# Patient Record
Sex: Male | Born: 1980 | Race: Black or African American | Hispanic: No | Marital: Single | Smoking: Former smoker
Health system: Southern US, Community
[De-identification: ages and names within clinical notes are randomized; demographics above are authoritative.]

## PROBLEM LIST (undated history)

## (undated) DIAGNOSIS — F209 Schizophrenia, unspecified: Secondary | ICD-10-CM

## (undated) DIAGNOSIS — G4730 Sleep apnea, unspecified: Secondary | ICD-10-CM

---

## 2003-12-10 ENCOUNTER — Emergency Department (HOSPITAL_COMMUNITY): Admission: EM | Admit: 2003-12-10 | Discharge: 2003-12-10 | Payer: Self-pay | Admitting: Emergency Medicine

## 2006-04-19 ENCOUNTER — Ambulatory Visit: Payer: Self-pay | Admitting: Psychiatry

## 2006-04-19 ENCOUNTER — Inpatient Hospital Stay (HOSPITAL_COMMUNITY): Admission: AD | Admit: 2006-04-19 | Discharge: 2006-04-29 | Payer: Self-pay | Admitting: Psychiatry

## 2014-07-26 DIAGNOSIS — F250 Schizoaffective disorder, bipolar type: Secondary | ICD-10-CM | POA: Diagnosis not present

## 2014-10-18 DIAGNOSIS — F250 Schizoaffective disorder, bipolar type: Secondary | ICD-10-CM | POA: Diagnosis not present

## 2015-01-10 DIAGNOSIS — F250 Schizoaffective disorder, bipolar type: Secondary | ICD-10-CM | POA: Diagnosis not present

## 2015-04-09 ENCOUNTER — Emergency Department (HOSPITAL_COMMUNITY): Payer: Medicare Other

## 2015-04-09 ENCOUNTER — Encounter (HOSPITAL_COMMUNITY): Payer: Self-pay

## 2015-04-09 ENCOUNTER — Emergency Department (HOSPITAL_COMMUNITY)
Admission: EM | Admit: 2015-04-09 | Discharge: 2015-04-10 | Disposition: A | Discharge status: Home / Self Care | Payer: Medicare Other | Attending: Emergency Medicine | Admitting: Emergency Medicine

## 2015-04-09 DIAGNOSIS — R05 Cough: Secondary | ICD-10-CM | POA: Diagnosis not present

## 2015-04-09 DIAGNOSIS — R079 Chest pain, unspecified: Secondary | ICD-10-CM | POA: Diagnosis not present

## 2015-04-09 DIAGNOSIS — Z87891 Personal history of nicotine dependence: Secondary | ICD-10-CM | POA: Diagnosis not present

## 2015-04-09 DIAGNOSIS — R509 Fever, unspecified: Secondary | ICD-10-CM | POA: Diagnosis not present

## 2015-04-09 DIAGNOSIS — R000 Tachycardia, unspecified: Secondary | ICD-10-CM | POA: Diagnosis not present

## 2015-04-09 DIAGNOSIS — F209 Schizophrenia, unspecified: Secondary | ICD-10-CM | POA: Insufficient documentation

## 2015-04-09 DIAGNOSIS — R0602 Shortness of breath: Secondary | ICD-10-CM | POA: Diagnosis not present

## 2015-04-09 DIAGNOSIS — R0989 Other specified symptoms and signs involving the circulatory and respiratory systems: Secondary | ICD-10-CM | POA: Diagnosis not present

## 2015-04-09 DIAGNOSIS — B349 Viral infection, unspecified: Secondary | ICD-10-CM | POA: Diagnosis not present

## 2015-04-09 DIAGNOSIS — R059 Cough, unspecified: Secondary | ICD-10-CM

## 2015-04-09 HISTORY — DX: Schizophrenia, unspecified: F20.9

## 2015-04-09 LAB — BASIC METABOLIC PANEL
Anion gap: 10 (ref 5–15)
BUN: 14 mg/dL (ref 6–20)
CALCIUM: 9.3 mg/dL (ref 8.9–10.3)
CO2: 25 mmol/L (ref 22–32)
CREATININE: 1.14 mg/dL (ref 0.61–1.24)
Chloride: 103 mmol/L (ref 101–111)
GFR calc non Af Amer: >60 mL/min (ref >60)
Glucose, Bld: 102 mg/dL — ABNORMAL HIGH (ref 65–99)
Potassium: 4.2 mmol/L (ref 3.5–5.1)
SODIUM: 138 mmol/L (ref 135–145)

## 2015-04-09 LAB — TROPONIN I: Troponin I: <0.03 ng/mL (ref <0.031)

## 2015-04-09 LAB — CBC WITH DIFFERENTIAL/PLATELET
BASOS PCT: 0 %
Basophils Absolute: 0 10*3/uL (ref 0.0–0.1)
EOS ABS: 0 10*3/uL (ref 0.0–0.7)
Eosinophils Relative: 0 %
HEMATOCRIT: 42.1 % (ref 39.0–52.0)
Hemoglobin: 14.2 g/dL (ref 13.0–17.0)
Lymphocytes Relative: 14 %
Lymphs Abs: 1.5 10*3/uL (ref 0.7–4.0)
MCH: 32.1 pg (ref 26.0–34.0)
MCHC: 33.7 g/dL (ref 30.0–36.0)
MCV: 95.2 fL (ref 78.0–100.0)
MONO ABS: 1.1 10*3/uL — AB (ref 0.1–1.0)
MONOS PCT: 10 %
Neutro Abs: 8.6 10*3/uL — ABNORMAL HIGH (ref 1.7–7.7)
Neutrophils Relative %: 76 %
Platelets: 162 10*3/uL (ref 150–400)
RBC: 4.42 MIL/uL (ref 4.22–5.81)
RDW: 14.2 % (ref 11.5–15.5)
WBC: 11.3 10*3/uL — ABNORMAL HIGH (ref 4.0–10.5)

## 2015-04-09 LAB — D-DIMER, QUANTITATIVE (NOT AT ARMC)

## 2015-04-09 MED ORDER — SODIUM CHLORIDE 0.9 % IV BOLUS (SEPSIS)
1000.0000 mL | Freq: Once | INTRAVENOUS | Status: AC
  Administered 2015-04-09: 1000 mL via INTRAVENOUS

## 2015-04-09 MED ORDER — ACETAMINOPHEN 325 MG PO TABS
ORAL_TABLET | ORAL | Status: AC
  Filled 2015-04-09: qty 2

## 2015-04-09 MED ORDER — IBUPROFEN 800 MG PO TABS
800.0000 mg | ORAL_TABLET | Freq: Once | ORAL | Status: AC
  Administered 2015-04-09: 800 mg via ORAL
  Filled 2015-04-09: qty 1

## 2015-04-09 MED ORDER — SODIUM CHLORIDE 0.9 % IV BOLUS (SEPSIS)
1000.0000 mL | Freq: Once | INTRAVENOUS | Status: AC
  Administered 2015-04-10: 1000 mL via INTRAVENOUS

## 2015-04-09 MED ORDER — ACETAMINOPHEN 325 MG PO TABS
650.0000 mg | ORAL_TABLET | Freq: Once | ORAL | Status: AC | PRN
  Administered 2015-04-09: 650 mg via ORAL

## 2015-04-09 NOTE — ED Provider Notes (Signed)
CSN: [id number]     Arrival date & time 04/09/15  1740 History   First MD Initiated Contact with Patient 04/09/15 2152     Chief Complaint  Patient presents with  . Fever  . Cough     (Consider location/radiation/quality/duration/timing/severity/associated sxs/prior Treatment) HPI Comments: Patient from home with 2 day history of intermittent right-sided chest pain that comes and goes. Associated with cough now productive of mucus. Fever today to 102 with tachycardia in triage. Denies any difficulty breathing or swallowing currently. Endorses sore throat which is now resolved. Denies any chest pain currently. With right-sided sharp and stabbing lasting just a few seconds at a time. No leg pain or leg swelling. History of schizophrenia and is a smoker. No recent long car trips or plane trips. No sick contacts.  Patient is a 34 y.o. male presenting with cough. The history is provided by the patient.  Cough Associated symptoms: fever and shortness of breath   Associated symptoms: no chest pain, no headaches, no myalgias and no rhinorrhea     Past Medical History  Diagnosis Date  . Schizophrenia    History reviewed. No pertinent past surgical history. History reviewed. No pertinent family history. Social History  Substance Use Topics  . Smoking status: Former Games developer  . Smokeless tobacco: None  . Alcohol Use: Yes     Comment: occ    Review of Systems  Constitutional: Positive for fever and activity change.  HENT: Negative for congestion and rhinorrhea.   Respiratory: Positive for cough, chest tightness and shortness of breath.   Cardiovascular: Negative for chest pain.  Gastrointestinal: Negative for nausea, vomiting and abdominal pain.  Genitourinary: Negative for dysuria, urgency and hematuria.  Musculoskeletal: Negative for myalgias, back pain and arthralgias.  Skin: Negative for wound.  Neurological: Negative for dizziness, weakness and headaches.  A complete 10 system  review of systems was obtained and all systems are negative except as noted in the HPI and PMH.      Allergies  Review of patient's allergies indicates no known allergies.  Home Medications   Prior to Admission medications   Medication Sig Start Date End Date Taking? Authorizing Provider  divalproex (DEPAKOTE ER) 500 MG 24 hr tablet Take 1,000 mg by mouth at bedtime.   Yes Historical Provider, MD  OLANZapine (ZYPREXA) 5 MG tablet Take 5 mg by mouth at bedtime.   Yes Historical Provider, MD  OLANZapine zydis (ZYPREXA) 20 MG disintegrating tablet Take 20 mg by mouth at bedtime.   Yes Historical Provider, MD   BP 125/73 mmHg  Pulse 90  Temp(Src) 98.8 F (37.1 C) (Oral)  Resp 16  Ht  (1.778 m)  Wt 128 lb 1.6 oz (58.106 kg)  BMI 18.38 kg/m2  SpO2 95% Physical Exam  Constitutional: He is oriented to person, place, and time. He appears well-developed and well-nourished. No distress.  Well appearing, no distress  HENT:  Head: Normocephalic and atraumatic.  Mouth/Throat: Oropharynx is clear and moist. No oropharyngeal exudate.  Eyes: Conjunctivae and EOM are normal. Pupils are equal, round, and reactive to light.  Neck: Normal range of motion. Neck supple.  No meningismus.  Cardiovascular: Normal rate, normal heart sounds and intact distal pulses.   No murmur heard. Tachycardia to 110  Pulmonary/Chest: Effort normal and breath sounds normal. No respiratory distress. He has no rales.  Scattered rhonchi throughout  Abdominal: Soft. There is no tenderness. There is no rebound and no guarding.  Musculoskeletal: Normal range of motion. He exhibits  no edema or tenderness.  Neurological: He is alert and oriented to person, place, and time. No cranial nerve deficit. He exhibits normal muscle tone. Coordination normal.  No ataxia on finger to nose bilaterally. No pronator drift. 5/5 strength throughout. CN 2-12 intact. Negative Romberg. Equal grip strength. Sensation intact. Gait is  normal.   Skin: Skin is warm.  Psychiatric: He has a normal mood and affect. His behavior is normal.  Nursing note and vitals reviewed.   ED Course  Procedures (including critical care time) Labs Review Labs Reviewed  CBC WITH DIFFERENTIAL/PLATELET - Abnormal; Notable for the following:    WBC 11.3 (*)    Neutro Abs 8.6 (*)    Monocytes Absolute 1.1 (*)    All other components within normal limits  BASIC METABOLIC PANEL - Abnormal; Notable for the following:    Glucose, Bld 102 (*)    All other components within normal limits  CULTURE, BLOOD (ROUTINE X 2)  CULTURE, BLOOD (ROUTINE X 2)  TROPONIN I  D-DIMER, QUANTITATIVE (NOT AT [facility])  I-STAT CG4 LACTIC ACID, ED    Imaging Review Dg Chest 2 View  04/09/2015   CLINICAL DATA:  Dry cough and fever began today. No chest pain or SOB.  EXAM: CHEST  2 VIEW  COMPARISON:  None.  FINDINGS: The heart size and mediastinal contours are within normal limits. Both lungs are clear. Mild perihilar peribronchial thickening. The visualized skeletal structures are unremarkable.  IMPRESSION: 1. Mild bronchitic changes. 2.  No focal acute pulmonary abnormality.   Electronically Signed   By: Norva Pavlov M.D.   On: 04/09/2015 23:00   I have personally reviewed and evaluated these images and lab results as part of my medical decision-making.   EKG Interpretation   Date/Time:  Tuesday April 09 2015 17:53:49 EDT Ventricular Rate:  130 PR Interval:  124 QRS Duration: 80 QT Interval:  294 QTC Calculation: 432 R Axis:   95 Text Interpretation:  Sinus tachycardia Right atrial enlargement Rightward  axis Borderline ECG No previous ECGs available Confirmed by Manus Gunning  MD,  STEPHEN [phone]) on 04/09/2015 11:50:41 PM      MDM   Final diagnoses:  Viral syndrome  Cough    right-sided chest pain cough and right-sided chest pain for the past 2 days. Febrile to 102.5 on arrival and tachycardic.   IV fluids given labs obtained. Chest x-ray shows  no infiltrate.   Labs unremarkable. Lactate normal.  D-dimer negative. Patient given IV fluids and antipyretics in the ED. Suspect viral syndrome causing cough, fever and tachycardia. Possibly influenza.  he is nontoxic appearing and tolerating by mouth. He is ambulatory without desaturation.  HR improved to 90.  Temp 98.8.  No hypoxia.  Needs to establish care with PCP. Return precautions discussed.  Glynn Octave, MD 04/10/15 [phone]

## 2015-04-09 NOTE — ED Notes (Signed)
Onset last night right sided chest pain x 2 that lasted few minutes.  No chest pain today.  Onset last night NP cough.  Pt reports fever today.  No swallowing or respiratory difficulties.  NAD.

## 2015-04-10 ENCOUNTER — Telehealth (HOSPITAL_COMMUNITY): Payer: Self-pay | Admitting: Emergency Medicine

## 2015-04-10 DIAGNOSIS — B349 Viral infection, unspecified: Secondary | ICD-10-CM | POA: Diagnosis not present

## 2015-04-10 LAB — I-STAT CG4 LACTIC ACID, ED: LACTIC ACID, VENOUS: 0.73 mmol/L (ref 0.5–2.0)

## 2015-04-10 NOTE — Discharge Instructions (Signed)
Cough, Adult ° A cough is a reflex that helps clear your throat and airways. It can help heal the body or may be a reaction to an irritated airway. A cough may only last 2 or 3 weeks (acute) or may last more than 8 weeks (chronic).  °CAUSES °Acute cough: °· Viral or bacterial infections. °Chronic cough: °· Infections. °· Allergies. °· Asthma. °· Post-nasal drip. °· Smoking. °· Heartburn or acid reflux. °· Some medicines. °· Chronic lung problems (COPD). °· Cancer. °SYMPTOMS  °· Cough. °· Fever. °· Chest pain. °· Increased breathing rate. °· High-pitched whistling sound when breathing (wheezing). °· Colored mucus that you cough up (sputum). °TREATMENT  °· A bacterial cough may be treated with antibiotic medicine. °· A viral cough must run its course and will not respond to antibiotics. °· Your caregiver may recommend other treatments if you have a chronic cough. °HOME CARE INSTRUCTIONS  °· Only take over-the-counter or prescription medicines for pain, discomfort, or fever as directed by your caregiver. Use cough suppressants only as directed by your caregiver. °· Use a cold steam vaporizer or humidifier in your bedroom or home to help loosen secretions. °· Sleep in a semi-upright position if your cough is worse at night. °· Rest as needed. °· Stop smoking if you smoke. °SEEK IMMEDIATE MEDICAL CARE IF:  °· You have pus in your sputum. °· Your cough starts to worsen. °· You cannot control your cough with suppressants and are losing sleep. °· You begin coughing up blood. °· You have difficulty breathing. °· You develop pain which is getting worse or is uncontrolled with medicine. °· You have a fever. °MAKE SURE YOU:  °· Understand these instructions. °· Will watch your condition. °· Will get help right away if you are not doing well or get worse. °Document Released: 01/02/2011 Document Revised: 09/28/2011 Document Reviewed: 01/02/2011 °ExitCare® Patient Information ©2015 [employer]. This information is not intended  to replace advice given to you by your health care provider. Make sure you discuss any questions you have with your health care provider. ° °Emergency Department Resource Guide °1) Find a Doctor and Pay Out of Pocket °Although you won't have to find out who is covered by your insurance plan, it is a good idea to ask around and get recommendations. You will then need to call the office and see if the doctor you have chosen will accept you as a new patient and what types of options they offer for patients who are self-pay. Some doctors offer discounts or will set up payment plans for their patients who do not have insurance, but you will need to ask so you aren't surprised when you get to your appointment. ° °2) Contact Your Local Health Department °Not all health departments have doctors that can see patients for sick visits, but many do, so it is worth a call to see if yours does. If you don't know where your local health department is, you can check in your phone book. The CDC also has a tool to help you locate your state's health department, and many state websites also have listings of all of their local health departments. ° °3) Find a Walk-in Clinic °If your illness is not likely to be very severe or complicated, you may want to try a walk in clinic. These are popping up all over the country in pharmacies, drugstores, and shopping centers. They're usually staffed by nurse practitioners or physician assistants that have been trained to treat common illnesses   and complaints. They're usually fairly quick and inexpensive. However, if you have serious medical issues or chronic medical problems, these are probably not your best option. ° °No Primary Care Doctor: °- Call Health Connect at  832-8000 - they can help you locate a primary care doctor that  accepts your insurance, provides certain services, etc. °- Physician Referral Service- 1-800-533-3463 ° °Chronic Pain Problems: °Organization         Address  Phone    Notes  °[facility]  (336) 297-2271 Patients need to be referred by their primary care doctor.  ° °Medication Assistance: °Organization         Address  Phone   Notes  °[place] [facility] [address] °[place], [place] [address] (336) 641-8030 --Must be a resident of [place] °-- Must have NO insurance coverage whatsoever (no Medicaid/ Medicare, etc.) °-- The pt. MUST have a primary care doctor that directs their care regularly and follows them in the community °  °[facility]  (866) 331-1348   °[employer]  (888) 892-1162   ° °Agencies that provide inexpensive medical care: °Organization         Address  Phone   Notes  °[facility]  (336) 832-8035   °[facility]    (336) 832-7272   °[facility] [address] °[place], [place] [address] (336) 832-4777   °[facility] [address], °[place] (336) 271-4999   °[facility]    (336) 373-0678   °[facility]    (336) 272-1050   °[facility] ° [address], [place] Phone:  (336) 832-4444, Fax:  (336) 832-4440 Hours of Operation:  9 am - 6 pm, M-F.  Also accepts Medicaid/Medicare and self-pay.  °[facility] ° [address], [place] Phone: (336) 832-3150, Fax: (336) 832-3151. Hours of Operation:  8:30 am - 5:30 pm, M-F.  Also accepts Medicaid and self-pay.  °[facility] [place] [address], [place] Phone: (336) 878-6027   °[facility] [address], [place], [place] (336)723-1848, Ext. 123 Mondays & Thursdays: 7-9 AM.  First 15 patients are seen on a first come, first serve basis. °  ° °Medicaid-accepting [place] Providers: ° °Organization         Address  Phone   Notes  °[facility] [address], [place] (336) 641-2100 Also accepts self-pay patients.  °[facility]  [address], [place] ° (336) 856-9996   °[facility] [address], [place] (336) 288-8857   °[facility] [address] [place] [address], [place] (336) 299-7000   °Veita Bland [address], [place]  ° (336) 373-1557 Only accepts [place] Access Medicaid patients after they have their name applied to their card.  ° °Self-Pay (no insurance) in [place]: ° °Organization         Address  Phone   Notes  °Sickle Cell Patients, [facility] [address], [place] (336) 832-1970   °[facility] [address], [place] (336) 832-4400   °[facility] ° [address], [place] (336) 992-4800   °[facility]/Dr. Osei-Bonsu ° [address] [place] [address],  or [address], [place] (336) 841-8500 Phone number for both [place] and [place] locations is the same.  °[facility] [address], [place] (336) 299-0000   °[facility] [address] [place] [address], [place] or [address] (336) 852-7530 °(336) 878-2260   °[facility] [address], [place] (336) 350-1642, phone; (336) 294-5005, fax Sees patients 1st and 3rd Saturday of every month.  Must not qualify for public or private insurance (i.e. Medicaid, Medicare, [place] Health Choice, Veterans' Benefits) • Household income should be no more than 200% of the poverty level •The clinic cannot treat you if you are pregnant or think you are pregnant • Sexually transmitted diseases are not treated at the clinic.  ° ° °Dental Care: °Organization         Address  Phone  Notes  °[facility] [address], [place] (336) 641-6152 Accepts children up to age 21 who are enrolled in Medicaid or [place] Health Choice; pregnant women with a Medicaid card; and children who have  applied for Medicaid or [place] Health Choice, but were declined, whose parents can pay a reduced fee at time of service.  °[facility] [place]  [address], [place] (336) 641-7733 Accepts children up to age 21 who are enrolled in Medicaid or [place] Health Choice; pregnant women with a Medicaid card; and children who have applied for Medicaid or [place] Health Choice, but were declined, whose parents can pay a reduced fee at time of service.  °[facility] ° [address], [place] (336) 641-4533 Patients are seen by appointment only. Walk-ins are not accepted. [facility] will see patients 18 years of age and older. °Monday - Tuesday (8am-5pm) °Most Wednesdays (8:30-5pm) °$30 per visit, cash only  °[facility] ° [address], [place] (336) 641-4533 Patients are seen by appointment only. Walk-ins are not accepted. [facility] will see patients 18 years of age and older. °One Wednesday Evening (Monthly: Volunteer Based).  $30 per visit, cash only  °[facility]  (919) 537-3737 for adults; Children under age 4, call Graduate Pediatric Dentistry at (919) [phone]. Children aged 4-14, please call (919) [phone] to request a pediatric application. ° Dental services are provided in all areas of dental care including fillings, crowns and bridges, complete and partial dentures, implants, gum treatment, root canals, and extractions. Preventive care is also provided. Treatment is provided to both adults and children. °Patients are selected via a lottery and there is often a waiting list. °  °[facility] [address], °[place] ° (336) 763-8833 [email or web address] °  °[facility] [address], [place], [place] (336)723-1848, Ext. 123 Second and Fourth Thursday of each month, opens at 6:30 AM; Clinic ends at 9 AM.  Patients are seen on a first-come first-served basis, and a  limited number are seen during each clinic.  ° °[facility] ° [address], [place], [place] (336) 723-7904   Eligibility Requirements °You must have lived in [place], [place], or [place] counties for at least the last three months. °  You cannot be eligible for state or federal sponsored healthcare insurance, including Veterans Administration, Medicaid, or Medicare. °  You generally cannot be eligible for healthcare insurance through your employer.  °  How to apply: °Eligibility screenings are held every Tuesday and Wednesday   afternoon from 1:00 pm until 4:00 pm. You do not need an appointment for the interview!  °[facility] [address], [place], [place] 336-631-2330   °[facility] [facility]  336-342-8273   °[facility]  336-703-3100   °[facility]  336-570-6415   ° °Behavioral Health Resources in the Community: °Intensive Outpatient Programs °Organization         Address  Phone  Notes  °[facility] [address], [place], [place] 336-878-6098   °[facility] [address], [place], [place] 336-832-9800   °[facility] [address], [place], [place] ° 336-882-2125   °[facility] [address],  °[place], [place] 1-800-853-5163 or 336-641-4981   °Substance Abuse Resources °Organization         Address  Phone  Notes  °[facility]  336-882-2125   °[facility]  336-784-9470   °[facility]  336-285-9073   °[facility]  336-845-3988   °Residential & Outpatient Substance Abuse Program  1-800-659-3381   °Psychological Services °Organization         Address  Phone  Notes  °[facility]  336- 832-9600   °[facility]  336- 378-7881   °[facility] [address], [place] 1-800-853-5163 or 336-641-4981   ° °Mobile Crisis Teams °Organization          Address  Phone  Notes  °[facility], Mobile Crisis Care Unit  1-877-626-1772   °Assertive °[facility] ° [address] [place], [place] 336-834-9664   °Sharon DeEsch [address] °[place] 336-554-5454   ° °Self-Help/Support Groups °Organization         Address  Phone             Notes  °[facility] [place] - variety of support groups  336- 373-1402 Call for more information  °Narcotics Anonymous (NA), [facility] [address], °[place]  2 meetings at this location  ° °Residential Treatment Programs °Organization         Address  Phone  Notes  °[facility] [address],    °[place]  1-866-801-8205   °[facility] ° [address], [place], [place] 704-293-8524   °[facility] [address], [place] 336-845-3988 Admissions: 8am-3pm M-F  °[facility] [address]    °[place], [place] 336-841-1104   °[facility] [address], [place], [place] 336-379-7146   °[facility] [address]  °[place], [place] 336-285-9073   °[facility] - Intensive Outpatient [address], [place], [place] 336-852-3033   °[facility] [address]  °[place], [place] 1-877-615-2722 or 336-784-9470   °[facility] [address] [place], [place] 336-227-7417 Accepts Medicaid  °[facility] [address]  °[place] 1-800-659-3381 Substance Abuse/Addiction Treatment  ° °[place] Behavioral Health Resources °Organization         Address  Phone  Notes  °[facility]  (888) 581-9988   °Julie Brannon, PhD [address] [place], [place]   (336) 349-5553 or (336) 951-0000   °[facility]   [address] °[place], [place] (336) 349-4454   °[facility] [address], [place],  (336) 342-8316 Insurance/Medicaid/sponsorship  through [facility]  °[facility] [address]                                    [place], [place] (336) 342-8316 Therapy/tele-psych/case  °[facility] [address]  ° [place], [place] (336) 349-2233    °Dr. Arfeen  (336) 349-4544   °[facility] [place]  [employer] [place] [facility] 1) [address], [place] °2) [address], [place] °3)  [address], [place] (336) 349-3220 °(336) 342-7768 ° °(336) 342-8140   °[place] [facility] (336) 342-1394 or (336) 342-3537 (After Hours)    ° ° °

## 2015-04-10 NOTE — ED Notes (Signed)
Pt able to dress independently in the room and ambulate to lobby with RN

## 2015-04-13 LAB — CULTURE, BLOOD (ROUTINE X 2)

## 2015-04-14 ENCOUNTER — Telehealth (HOSPITAL_COMMUNITY): Payer: Self-pay

## 2015-04-14 NOTE — Progress Notes (Signed)
ED Antimicrobial Stewardship Positive Culture Follow Up   Cesar Robinson is an 34 y.o. male who presented to [facility] on 04/09/2015 with a chief complaint of  Chief Complaint  Patient presents with  . Fever  . Cough    Recent Results (from the past 720 hour(s))  Blood culture (routine x 2)     Status: None (Preliminary result)   Collection Time: 04/09/15 10:49 PM  Result Value Ref Range Status   Specimen Description BLOOD RIGHT ARM  Final   Special Requests BOTTLES DRAWN AEROBIC ONLY 10CC  Final   Culture NO GROWTH 3 DAYS  Final   Report Status PENDING  Incomplete  Blood culture (routine x 2)     Status: None   Collection Time: 04/10/15 12:23 AM  Result Value Ref Range Status   Specimen Description BLOOD RIGHT ARM  Final   Special Requests BOTTLES DRAWN AEROBIC AND ANAEROBIC 4CC  Final   Culture  Setup Time   Final    GRAM POSITIVE COCCI IN PAIRS IN CHAINS IN BOTH AEROBIC AND ANAEROBIC BOTTLES CRITICAL RESULT CALLED TO, READ BACK BY AND VERIFIED WITH: Cesar Mallick RN 1552 04/10/15 A BROWNING    Culture   Final    VIRIDANS STREPTOCOCCUS STAPHYLOCOCCUS SPECIES (COAGULASE NEGATIVE) THE SIGNIFICANCE OF ISOLATING THIS ORGANISM FROM A SINGLE VENIPUNCTURE CANNOT BE PREDICTED WITHOUT FURTHER CLINICAL AND CULTURE CORRELATION. SUSCEPTIBILITIES AVAILABLE ONLY ON REQUEST.    Report Status 04/13/2015 FINAL  Final    Patient had 1/2 BC positive for CONS. No tx indicated  ED Provider: Catha Gosselin PA-C   Bertram Millard 04/14/2015, 9:16 AM Infectious Diseases Pharmacist Phone# [phone]

## 2015-04-14 NOTE — Telephone Encounter (Signed)
Post ED Visit - Positive Culture Follow-up  Culture report reviewed by antimicrobial stewardship pharmacist:   Celedonio Miyamoto, Pharm.D., BCPS  Georgina Pillion, Pharm.D., BCPS  [place], [place].D., BCPS, AAHIVP  Estella Husk, Pharm.D., BCPS, AAHIVP  [place], [address].D.  Tennis Must, Pharm.D. Jules Husbands  Positive blood culture  no further patient follow-up is required at this time.  Ashley Jacobs 04/14/2015, 10:48 AM

## 2015-04-15 LAB — CULTURE, BLOOD (ROUTINE X 2): Culture: NO GROWTH

## 2015-04-17 DIAGNOSIS — F250 Schizoaffective disorder, bipolar type: Secondary | ICD-10-CM | POA: Diagnosis not present

## 2015-05-08 DIAGNOSIS — Z131 Encounter for screening for diabetes mellitus: Secondary | ICD-10-CM | POA: Diagnosis not present

## 2015-05-08 DIAGNOSIS — J069 Acute upper respiratory infection, unspecified: Secondary | ICD-10-CM | POA: Diagnosis not present

## 2015-05-08 DIAGNOSIS — Z13220 Encounter for screening for lipoid disorders: Secondary | ICD-10-CM | POA: Diagnosis not present

## 2015-05-08 DIAGNOSIS — Z1389 Encounter for screening for other disorder: Secondary | ICD-10-CM | POA: Diagnosis not present

## 2015-05-08 DIAGNOSIS — F17200 Nicotine dependence, unspecified, uncomplicated: Secondary | ICD-10-CM | POA: Diagnosis not present

## 2015-05-08 DIAGNOSIS — F2089 Other schizophrenia: Secondary | ICD-10-CM | POA: Diagnosis not present

## 2015-05-25 ENCOUNTER — Telehealth (HOSPITAL_COMMUNITY): Payer: Self-pay

## 2015-05-25 NOTE — Telephone Encounter (Signed)
Unable to contact pt by mail or telephone. Unable to communicate lab results or treatment changes. 

## 2015-06-19 DIAGNOSIS — F17210 Nicotine dependence, cigarettes, uncomplicated: Secondary | ICD-10-CM | POA: Diagnosis not present

## 2015-06-19 DIAGNOSIS — F2089 Other schizophrenia: Secondary | ICD-10-CM | POA: Diagnosis not present

## 2015-07-11 DIAGNOSIS — F250 Schizoaffective disorder, bipolar type: Secondary | ICD-10-CM | POA: Diagnosis not present

## 2015-08-19 DIAGNOSIS — F250 Schizoaffective disorder, bipolar type: Secondary | ICD-10-CM | POA: Diagnosis not present

## 2015-09-16 DIAGNOSIS — H6123 Impacted cerumen, bilateral: Secondary | ICD-10-CM | POA: Diagnosis not present

## 2015-09-16 DIAGNOSIS — F17210 Nicotine dependence, cigarettes, uncomplicated: Secondary | ICD-10-CM | POA: Diagnosis not present

## 2015-09-16 DIAGNOSIS — F2089 Other schizophrenia: Secondary | ICD-10-CM | POA: Diagnosis not present

## 2015-11-12 DIAGNOSIS — F250 Schizoaffective disorder, bipolar type: Secondary | ICD-10-CM | POA: Diagnosis not present

## 2016-02-06 DIAGNOSIS — F250 Schizoaffective disorder, bipolar type: Secondary | ICD-10-CM | POA: Diagnosis not present

## 2016-03-16 DIAGNOSIS — F2089 Other schizophrenia: Secondary | ICD-10-CM | POA: Diagnosis not present

## 2016-03-16 DIAGNOSIS — H6123 Impacted cerumen, bilateral: Secondary | ICD-10-CM | POA: Diagnosis not present

## 2016-03-16 DIAGNOSIS — Z13220 Encounter for screening for lipoid disorders: Secondary | ICD-10-CM | POA: Diagnosis not present

## 2016-03-16 DIAGNOSIS — Z131 Encounter for screening for diabetes mellitus: Secondary | ICD-10-CM | POA: Diagnosis not present

## 2016-03-16 DIAGNOSIS — Z79899 Other long term (current) drug therapy: Secondary | ICD-10-CM | POA: Diagnosis not present

## 2016-04-06 DIAGNOSIS — F250 Schizoaffective disorder, bipolar type: Secondary | ICD-10-CM | POA: Diagnosis not present

## 2016-06-29 DIAGNOSIS — F250 Schizoaffective disorder, bipolar type: Secondary | ICD-10-CM | POA: Diagnosis not present

## 2016-09-17 DIAGNOSIS — F2089 Other schizophrenia: Secondary | ICD-10-CM | POA: Diagnosis not present

## 2016-09-17 DIAGNOSIS — F17210 Nicotine dependence, cigarettes, uncomplicated: Secondary | ICD-10-CM | POA: Diagnosis not present

## 2016-09-17 DIAGNOSIS — Z716 Tobacco abuse counseling: Secondary | ICD-10-CM | POA: Diagnosis not present

## 2016-10-01 DIAGNOSIS — Z79899 Other long term (current) drug therapy: Secondary | ICD-10-CM | POA: Diagnosis not present

## 2016-10-01 DIAGNOSIS — F250 Schizoaffective disorder, bipolar type: Secondary | ICD-10-CM | POA: Diagnosis not present

## 2017-01-28 DIAGNOSIS — F250 Schizoaffective disorder, bipolar type: Secondary | ICD-10-CM | POA: Diagnosis not present

## 2017-04-08 DIAGNOSIS — F250 Schizoaffective disorder, bipolar type: Secondary | ICD-10-CM | POA: Diagnosis not present

## 2017-05-26 IMAGING — DX DG CHEST 2V
2 series · 2 of 2 positions shown · non-contrast
Comparison: None.

CLINICAL DATA: Dry cough and fever began today. No chest pain or
SOB.

EXAM:
CHEST  2 VIEW

[w chest pa]
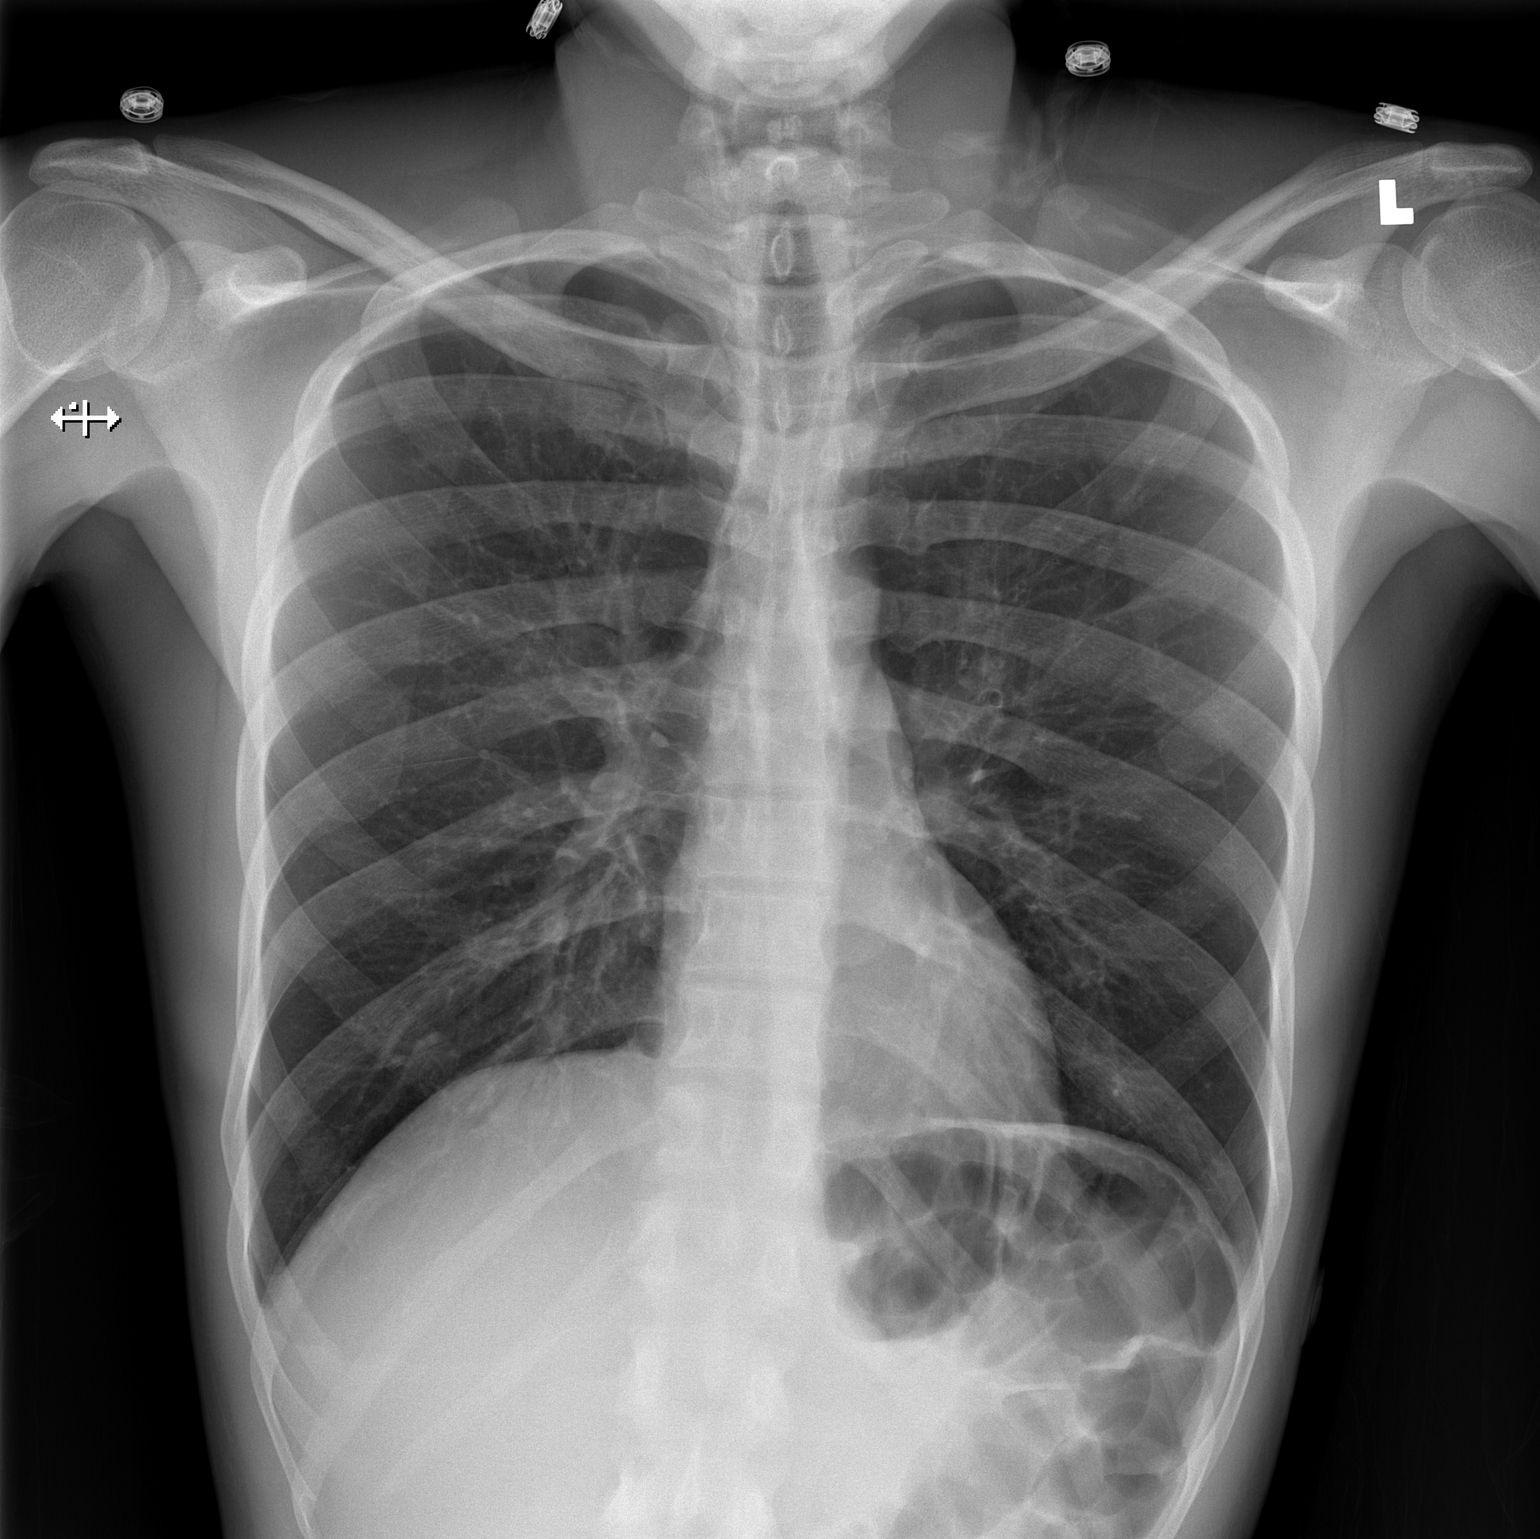

[w chest lat]
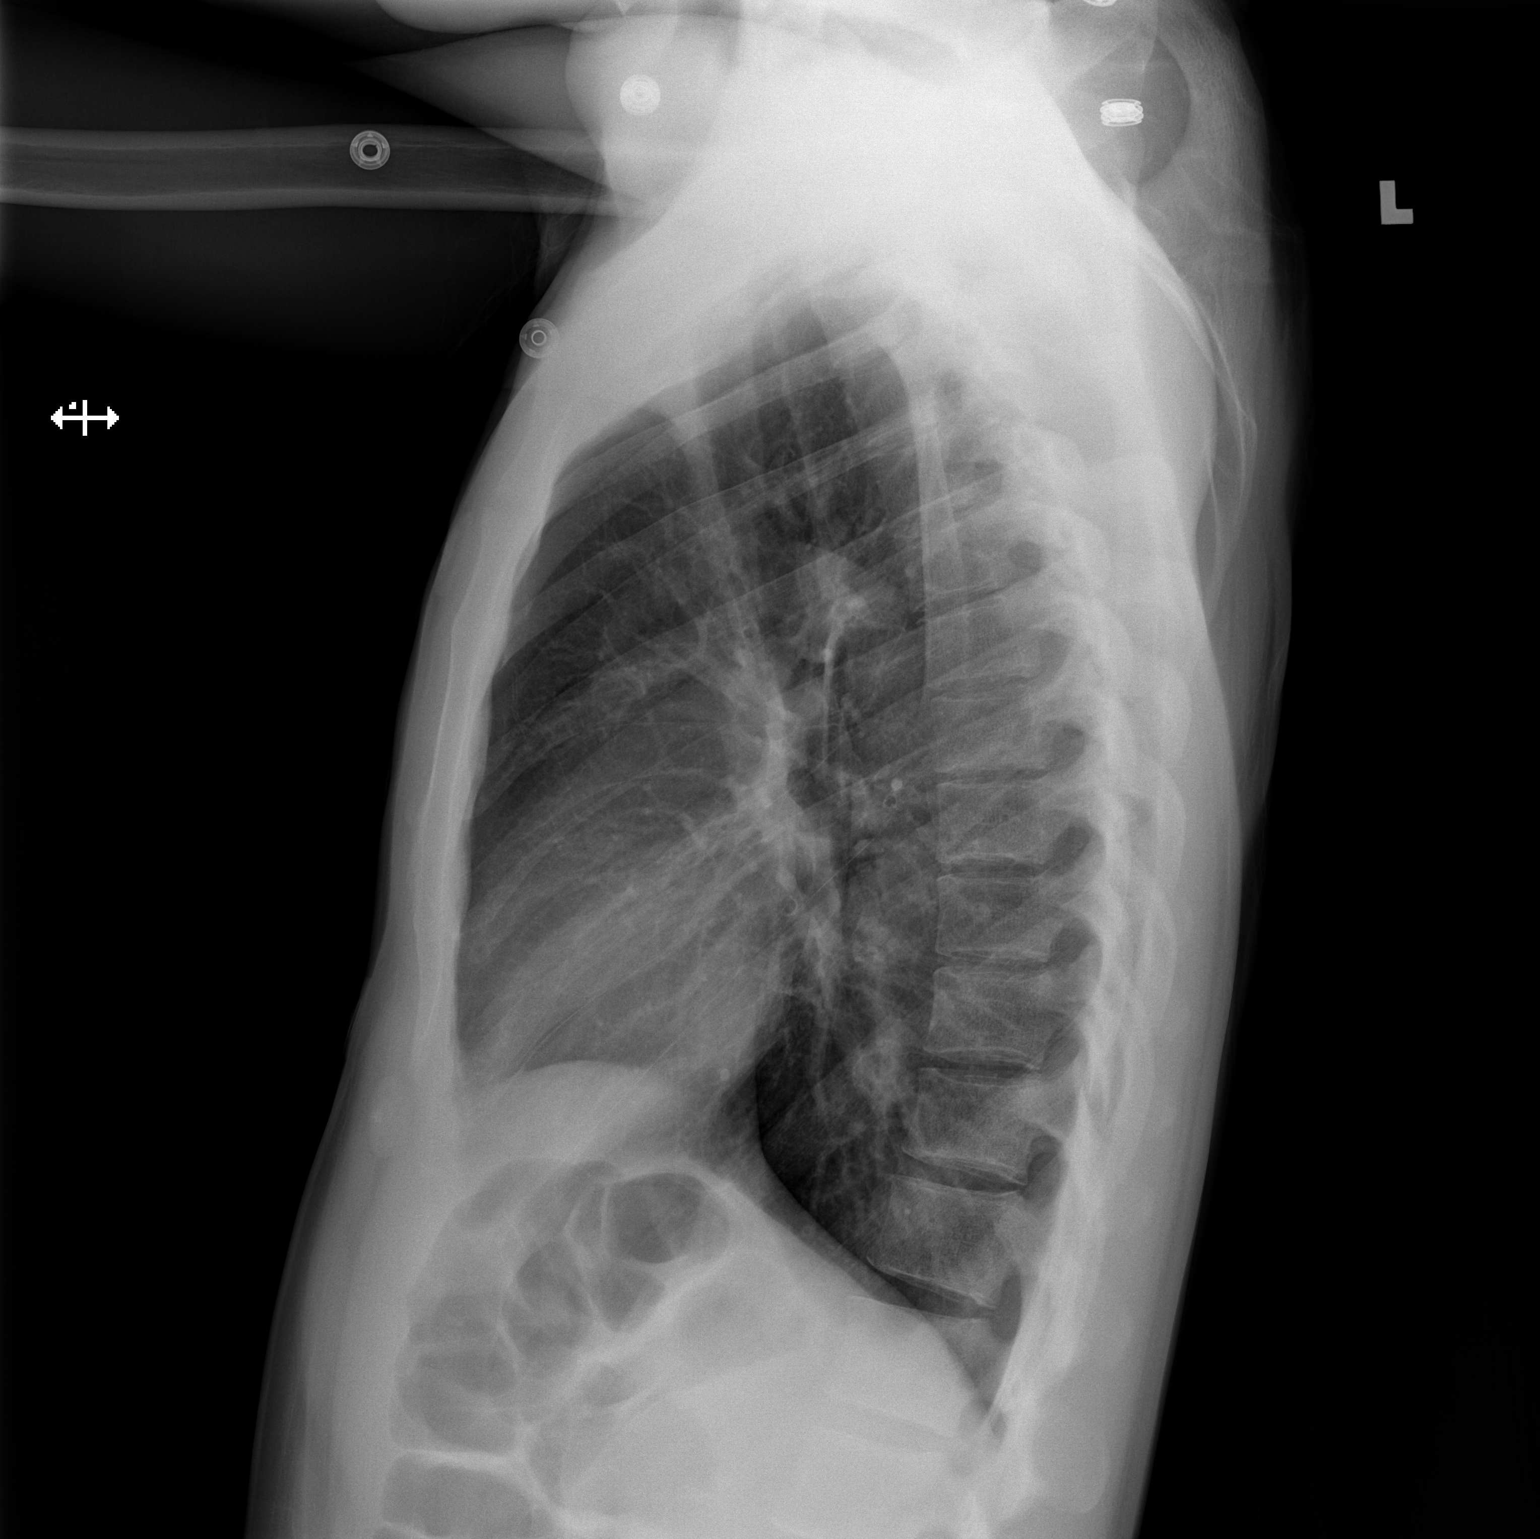

[2 of 2 positions shown; findings below may reference images not displayed]

FINDINGS: The heart size and mediastinal contours are within normal limits.
Both lungs are clear. Mild perihilar peribronchial thickening. The
visualized skeletal structures are unremarkable.
IMPRESSION: 1. Mild bronchitic changes.
2.  No focal acute pulmonary abnormality.

## 2017-06-24 DIAGNOSIS — F250 Schizoaffective disorder, bipolar type: Secondary | ICD-10-CM | POA: Diagnosis not present

## 2017-09-10 DIAGNOSIS — Z716 Tobacco abuse counseling: Secondary | ICD-10-CM | POA: Diagnosis not present

## 2017-09-10 DIAGNOSIS — Z0000 Encounter for general adult medical examination without abnormal findings: Secondary | ICD-10-CM | POA: Diagnosis not present

## 2017-09-10 DIAGNOSIS — F2089 Other schizophrenia: Secondary | ICD-10-CM | POA: Diagnosis not present

## 2017-09-10 DIAGNOSIS — Z13220 Encounter for screening for lipoid disorders: Secondary | ICD-10-CM | POA: Diagnosis not present

## 2017-09-10 DIAGNOSIS — F17210 Nicotine dependence, cigarettes, uncomplicated: Secondary | ICD-10-CM | POA: Diagnosis not present

## 2017-09-10 DIAGNOSIS — Z113 Encounter for screening for infections with a predominantly sexual mode of transmission: Secondary | ICD-10-CM | POA: Diagnosis not present

## 2017-09-10 DIAGNOSIS — Z131 Encounter for screening for diabetes mellitus: Secondary | ICD-10-CM | POA: Diagnosis not present

## 2017-10-28 DIAGNOSIS — F250 Schizoaffective disorder, bipolar type: Secondary | ICD-10-CM | POA: Diagnosis not present

## 2018-03-31 DIAGNOSIS — F250 Schizoaffective disorder, bipolar type: Secondary | ICD-10-CM | POA: Diagnosis not present

## 2018-06-30 DIAGNOSIS — F250 Schizoaffective disorder, bipolar type: Secondary | ICD-10-CM | POA: Diagnosis not present

## 2018-10-20 DIAGNOSIS — F250 Schizoaffective disorder, bipolar type: Secondary | ICD-10-CM | POA: Diagnosis not present

## 2019-01-12 DIAGNOSIS — F250 Schizoaffective disorder, bipolar type: Secondary | ICD-10-CM | POA: Diagnosis not present

## 2019-06-01 DIAGNOSIS — Z0000 Encounter for general adult medical examination without abnormal findings: Secondary | ICD-10-CM | POA: Diagnosis not present

## 2019-06-01 DIAGNOSIS — Z79899 Other long term (current) drug therapy: Secondary | ICD-10-CM | POA: Diagnosis not present

## 2019-06-01 DIAGNOSIS — Z23 Encounter for immunization: Secondary | ICD-10-CM | POA: Diagnosis not present

## 2019-06-01 DIAGNOSIS — Z1159 Encounter for screening for other viral diseases: Secondary | ICD-10-CM | POA: Diagnosis not present

## 2019-06-01 DIAGNOSIS — F209 Schizophrenia, unspecified: Secondary | ICD-10-CM | POA: Diagnosis not present

## 2020-05-28 ENCOUNTER — Other Ambulatory Visit: Payer: Self-pay | Admitting: Physician Assistant

## 2020-05-28 DIAGNOSIS — H47293 Other optic atrophy, bilateral: Secondary | ICD-10-CM

## 2023-08-01 ENCOUNTER — Encounter (HOSPITAL_COMMUNITY): Payer: Self-pay

## 2023-08-01 ENCOUNTER — Inpatient Hospital Stay (HOSPITAL_COMMUNITY)
Admission: EM | Admit: 2023-08-01 | Discharge: 2023-08-05 | DRG: 558 | Disposition: A | Discharge status: Other Acute Inpatient Hospital | Payer: 59 | Attending: Internal Medicine | Admitting: Internal Medicine

## 2023-08-01 DIAGNOSIS — M6282 Rhabdomyolysis: Principal | ICD-10-CM | POA: Diagnosis present

## 2023-08-01 DIAGNOSIS — Z7902 Long term (current) use of antithrombotics/antiplatelets: Secondary | ICD-10-CM

## 2023-08-01 DIAGNOSIS — D72829 Elevated white blood cell count, unspecified: Secondary | ICD-10-CM | POA: Diagnosis present

## 2023-08-01 DIAGNOSIS — F1210 Cannabis abuse, uncomplicated: Secondary | ICD-10-CM | POA: Diagnosis present

## 2023-08-01 DIAGNOSIS — E8729 Other acidosis: Secondary | ICD-10-CM

## 2023-08-01 DIAGNOSIS — E860 Dehydration: Secondary | ICD-10-CM | POA: Diagnosis present

## 2023-08-01 DIAGNOSIS — G4730 Sleep apnea, unspecified: Secondary | ICD-10-CM | POA: Diagnosis present

## 2023-08-01 DIAGNOSIS — Z79899 Other long term (current) drug therapy: Secondary | ICD-10-CM

## 2023-08-01 DIAGNOSIS — Z91199 Patient's noncompliance with other medical treatment and regimen due to unspecified reason: Secondary | ICD-10-CM

## 2023-08-01 DIAGNOSIS — R944 Abnormal results of kidney function studies: Secondary | ICD-10-CM | POA: Diagnosis present

## 2023-08-01 DIAGNOSIS — F209 Schizophrenia, unspecified: Principal | ICD-10-CM | POA: Diagnosis present

## 2023-08-01 DIAGNOSIS — R6510 Systemic inflammatory response syndrome (SIRS) of non-infectious origin without acute organ dysfunction: Secondary | ICD-10-CM | POA: Diagnosis present

## 2023-08-01 DIAGNOSIS — F17210 Nicotine dependence, cigarettes, uncomplicated: Secondary | ICD-10-CM | POA: Diagnosis present

## 2023-08-01 DIAGNOSIS — Z91148 Patient's other noncompliance with medication regimen for other reason: Secondary | ICD-10-CM

## 2023-08-01 DIAGNOSIS — R7401 Elevation of levels of liver transaminase levels: Secondary | ICD-10-CM | POA: Diagnosis present

## 2023-08-01 DIAGNOSIS — F23 Brief psychotic disorder: Secondary | ICD-10-CM | POA: Diagnosis present

## 2023-08-01 DIAGNOSIS — F1290 Cannabis use, unspecified, uncomplicated: Secondary | ICD-10-CM | POA: Diagnosis present

## 2023-08-01 DIAGNOSIS — R000 Tachycardia, unspecified: Secondary | ICD-10-CM | POA: Diagnosis present

## 2023-08-01 DIAGNOSIS — F200 Paranoid schizophrenia: Secondary | ICD-10-CM | POA: Diagnosis present

## 2023-08-01 DIAGNOSIS — Z046 Encounter for general psychiatric examination, requested by authority: Secondary | ICD-10-CM

## 2023-08-01 DIAGNOSIS — F32A Depression, unspecified: Secondary | ICD-10-CM | POA: Diagnosis present

## 2023-08-01 DIAGNOSIS — E872 Acidosis, unspecified: Secondary | ICD-10-CM | POA: Diagnosis present

## 2023-08-01 HISTORY — DX: Sleep apnea, unspecified: G47.30

## 2023-08-01 LAB — CBC WITH DIFFERENTIAL/PLATELET
Abs Immature Granulocytes: 0.06 10*3/uL (ref 0.00–0.07)
Basophils Absolute: 0 10*3/uL (ref 0.0–0.1)
Basophils Relative: 0 %
Eosinophils Absolute: 0 10*3/uL (ref 0.0–0.5)
Eosinophils Relative: 0 %
HCT: 40.2 % (ref 39.0–52.0)
Hemoglobin: 13.3 g/dL (ref 13.0–17.0)
Immature Granulocytes: 0 %
Lymphocytes Relative: 14 %
Lymphs Abs: 2.1 10*3/uL (ref 0.7–4.0)
MCH: 32.2 pg (ref 26.0–34.0)
MCHC: 33.1 g/dL (ref 30.0–36.0)
MCV: 97.3 fL (ref 80.0–100.0)
Monocytes Absolute: 1 10*3/uL (ref 0.1–1.0)
Monocytes Relative: 7 %
Neutro Abs: 11.6 10*3/uL — ABNORMAL HIGH (ref 1.7–7.7)
Neutrophils Relative %: 79 %
Platelets: 220 10*3/uL (ref 150–400)
RBC: 4.13 MIL/uL — ABNORMAL LOW (ref 4.22–5.81)
RDW: 14.7 % (ref 11.5–15.5)
WBC: 14.7 10*3/uL — ABNORMAL HIGH (ref 4.0–10.5)
nRBC: 0 % (ref 0.0–0.2)

## 2023-08-01 LAB — COMPREHENSIVE METABOLIC PANEL
ALT: 27 U/L (ref 0–44)
AST: 79 U/L — ABNORMAL HIGH (ref 15–41)
Albumin: 4.8 g/dL (ref 3.5–5.0)
Alkaline Phosphatase: 61 U/L (ref 38–126)
Anion gap: 18 — ABNORMAL HIGH (ref 5–15)
BUN: 24 mg/dL — ABNORMAL HIGH (ref 6–20)
CO2: 15 mmol/L — ABNORMAL LOW (ref 22–32)
Calcium: 9.8 mg/dL (ref 8.9–10.3)
Chloride: 108 mmol/L (ref 98–111)
Creatinine, Ser: 1.04 mg/dL (ref 0.61–1.24)
GFR, Estimated: >60 mL/min (ref >60)
Glucose, Bld: 86 mg/dL (ref 70–99)
Potassium: 3.9 mmol/L (ref 3.5–5.1)
Sodium: 141 mmol/L (ref 135–145)
Total Bilirubin: 1.2 mg/dL (ref 0.0–1.2)
Total Protein: 8.1 g/dL (ref 6.5–8.1)

## 2023-08-01 LAB — ETHANOL: Alcohol, Ethyl (B): <10 mg/dL (ref <10)

## 2023-08-01 LAB — ACETAMINOPHEN LEVEL: Acetaminophen (Tylenol), Serum: <10 ug/mL — ABNORMAL LOW (ref 10–30)

## 2023-08-01 LAB — SALICYLATE LEVEL: Salicylate Lvl: <7 mg/dL — ABNORMAL LOW (ref 7.0–30.0)

## 2023-08-01 MED ORDER — LORAZEPAM 2 MG/ML IJ SOLN
2.0000 mg | Freq: Once | INTRAMUSCULAR | Status: AC
  Administered 2023-08-01: 2 mg via INTRAMUSCULAR
  Filled 2023-08-01: qty 1

## 2023-08-01 MED ORDER — HALOPERIDOL LACTATE 5 MG/ML IJ SOLN
5.0000 mg | Freq: Once | INTRAMUSCULAR | Status: AC
  Administered 2023-08-01: 5 mg via INTRAMUSCULAR
  Filled 2023-08-01: qty 1
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
## 2023-08-01 NOTE — ED Notes (Signed)
 Pt is exhibiting paranoid behaviors and is very guarded. He did allow this staff to draw blood and give injection. [employer] standing by for presence and assistance in necessary.
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
## 2023-08-02 ENCOUNTER — Other Ambulatory Visit: Payer: Self-pay

## 2023-08-02 ENCOUNTER — Encounter (HOSPITAL_COMMUNITY): Payer: Self-pay | Admitting: Internal Medicine

## 2023-08-02 ENCOUNTER — Observation Stay (HOSPITAL_COMMUNITY): Payer: 59

## 2023-08-02 DIAGNOSIS — E872 Acidosis, unspecified: Secondary | ICD-10-CM | POA: Diagnosis present

## 2023-08-02 DIAGNOSIS — R7401 Elevation of levels of liver transaminase levels: Secondary | ICD-10-CM | POA: Diagnosis present

## 2023-08-02 DIAGNOSIS — F203 Undifferentiated schizophrenia: Secondary | ICD-10-CM | POA: Diagnosis not present

## 2023-08-02 DIAGNOSIS — D72829 Elevated white blood cell count, unspecified: Secondary | ICD-10-CM | POA: Diagnosis present

## 2023-08-02 DIAGNOSIS — R6510 Systemic inflammatory response syndrome (SIRS) of non-infectious origin without acute organ dysfunction: Secondary | ICD-10-CM | POA: Diagnosis present

## 2023-08-02 DIAGNOSIS — Z79899 Other long term (current) drug therapy: Secondary | ICD-10-CM | POA: Diagnosis not present

## 2023-08-02 DIAGNOSIS — F209 Schizophrenia, unspecified: Secondary | ICD-10-CM | POA: Diagnosis present

## 2023-08-02 DIAGNOSIS — Z7902 Long term (current) use of antithrombotics/antiplatelets: Secondary | ICD-10-CM | POA: Diagnosis not present

## 2023-08-02 DIAGNOSIS — F23 Brief psychotic disorder: Secondary | ICD-10-CM

## 2023-08-02 DIAGNOSIS — M6282 Rhabdomyolysis: Secondary | ICD-10-CM | POA: Diagnosis present

## 2023-08-02 DIAGNOSIS — G4730 Sleep apnea, unspecified: Secondary | ICD-10-CM | POA: Diagnosis present

## 2023-08-02 DIAGNOSIS — F1210 Cannabis abuse, uncomplicated: Secondary | ICD-10-CM | POA: Diagnosis present

## 2023-08-02 DIAGNOSIS — Z046 Encounter for general psychiatric examination, requested by authority: Secondary | ICD-10-CM | POA: Diagnosis not present

## 2023-08-02 DIAGNOSIS — E8729 Other acidosis: Secondary | ICD-10-CM | POA: Diagnosis not present

## 2023-08-02 DIAGNOSIS — F1290 Cannabis use, unspecified, uncomplicated: Secondary | ICD-10-CM

## 2023-08-02 DIAGNOSIS — F32A Depression, unspecified: Secondary | ICD-10-CM | POA: Diagnosis present

## 2023-08-02 DIAGNOSIS — E860 Dehydration: Secondary | ICD-10-CM | POA: Diagnosis present

## 2023-08-02 DIAGNOSIS — Z91148 Patient's other noncompliance with medication regimen for other reason: Secondary | ICD-10-CM | POA: Diagnosis not present

## 2023-08-02 DIAGNOSIS — R944 Abnormal results of kidney function studies: Secondary | ICD-10-CM | POA: Diagnosis present

## 2023-08-02 DIAGNOSIS — Z720 Tobacco use: Secondary | ICD-10-CM

## 2023-08-02 DIAGNOSIS — R000 Tachycardia, unspecified: Secondary | ICD-10-CM | POA: Diagnosis present

## 2023-08-02 DIAGNOSIS — F200 Paranoid schizophrenia: Secondary | ICD-10-CM | POA: Diagnosis present

## 2023-08-02 DIAGNOSIS — F17210 Nicotine dependence, cigarettes, uncomplicated: Secondary | ICD-10-CM | POA: Diagnosis present

## 2023-08-02 DIAGNOSIS — Z91199 Patient's noncompliance with other medical treatment and regimen due to unspecified reason: Secondary | ICD-10-CM | POA: Diagnosis not present

## 2023-08-02 LAB — VALPROIC ACID LEVEL: Valproic Acid Lvl: <10 ug/mL — ABNORMAL LOW (ref 50.0–100.0)

## 2023-08-02 LAB — RAPID URINE DRUG SCREEN, HOSP PERFORMED
Amphetamines: NOT DETECTED
Barbiturates: NOT DETECTED
Benzodiazepines: NOT DETECTED
Cocaine: NOT DETECTED
Opiates: NOT DETECTED
Tetrahydrocannabinol: POSITIVE — AB

## 2023-08-02 LAB — COMPREHENSIVE METABOLIC PANEL
ALT: 23 U/L (ref 0–44)
AST: 69 U/L — ABNORMAL HIGH (ref 15–41)
Albumin: 3.8 g/dL (ref 3.5–5.0)
Alkaline Phosphatase: 52 U/L (ref 38–126)
Anion gap: 16 — ABNORMAL HIGH (ref 5–15)
BUN: 22 mg/dL — ABNORMAL HIGH (ref 6–20)
CO2: 15 mmol/L — ABNORMAL LOW (ref 22–32)
Calcium: 8.3 mg/dL — ABNORMAL LOW (ref 8.9–10.3)
Chloride: 106 mmol/L (ref 98–111)
Creatinine, Ser: 0.99 mg/dL (ref 0.61–1.24)
GFR, Estimated: >60 mL/min (ref >60)
Glucose, Bld: 70 mg/dL (ref 70–99)
Potassium: 4.3 mmol/L (ref 3.5–5.1)
Sodium: 137 mmol/L (ref 135–145)
Total Bilirubin: 1.1 mg/dL (ref 0.0–1.2)
Total Protein: 6.7 g/dL (ref 6.5–8.1)

## 2023-08-02 LAB — LACTIC ACID, PLASMA: Lactic Acid, Venous: 1.2 mmol/L (ref 0.5–1.9)

## 2023-08-02 LAB — HEPATITIS PANEL, ACUTE
HCV Ab: NONREACTIVE
Hep A IgM: NONREACTIVE
Hep B C IgM: NONREACTIVE
Hepatitis B Surface Ag: NONREACTIVE

## 2023-08-02 LAB — URINALYSIS, W/ REFLEX TO CULTURE (INFECTION SUSPECTED)
Bacteria, UA: NONE SEEN
Bilirubin Urine: NEGATIVE
Glucose, UA: NEGATIVE mg/dL
Ketones, ur: 5 mg/dL — AB
Leukocytes,Ua: NEGATIVE
Nitrite: NEGATIVE
Protein, ur: NEGATIVE mg/dL
RBC / HPF: >50 RBC/hpf (ref 0–5)
Specific Gravity, Urine: 1.01 (ref 1.005–1.030)
pH: 6 (ref 5.0–8.0)

## 2023-08-02 LAB — HIV ANTIBODY (ROUTINE TESTING W REFLEX): HIV Screen 4th Generation wRfx: NONREACTIVE

## 2023-08-02 LAB — C-REACTIVE PROTEIN: CRP: <0.5 mg/dL (ref <1.0)

## 2023-08-02 LAB — PROCALCITONIN: Procalcitonin: 0.1 ng/mL

## 2023-08-02 LAB — OSMOLALITY: Osmolality: 311 mosm/kg — ABNORMAL HIGH (ref 275–295)

## 2023-08-02 LAB — SEDIMENTATION RATE: Sed Rate: 3 mm/h (ref 0–16)

## 2023-08-02 LAB — CK: Total CK: 3211 U/L — ABNORMAL HIGH (ref 49–397)

## 2023-08-02 MED ORDER — ONDANSETRON HCL 4 MG PO TABS: 4.0000 mg | ORAL_TABLET | Freq: Four times a day (QID) | ORAL | Status: DC | PRN

## 2023-08-02 MED ORDER — LACTATED RINGERS IV SOLN: INTRAVENOUS | Status: DC

## 2023-08-02 MED ORDER — SODIUM CHLORIDE 0.9 % IV BOLUS
1000.0000 mL | Freq: Once | INTRAVENOUS | Status: AC
  Administered 2023-08-02: 1000 mL via INTRAVENOUS

## 2023-08-02 MED ORDER — CALCIUM GLUCONATE-NACL 1-0.675 GM/50ML-% IV SOLN
1.0000 g | Freq: Once | INTRAVENOUS | Status: AC
  Administered 2023-08-02: 1000 mg via INTRAVENOUS
  Filled 2023-08-02: qty 50

## 2023-08-02 MED ORDER — ZIPRASIDONE MESYLATE 20 MG IM SOLR: 20.0000 mg | Freq: Once | INTRAMUSCULAR | Status: DC

## 2023-08-02 MED ORDER — NICOTINE 14 MG/24HR TD PT24
14.0000 mg | MEDICATED_PATCH | Freq: Every day | TRANSDERMAL | Status: DC
  Administered 2023-08-03 – 2023-08-05 (×2): 14 mg via TRANSDERMAL
  Filled 2023-08-02 (×3): qty 1

## 2023-08-02 MED ORDER — DIVALPROEX SODIUM ER 500 MG PO TB24
1000.0000 mg | ORAL_TABLET | Freq: Every day | ORAL | Status: DC
  Administered 2023-08-02 – 2023-08-04 (×3): 1000 mg via ORAL
  Filled 2023-08-02 (×4): qty 2

## 2023-08-02 MED ORDER — ACETAMINOPHEN 650 MG RE SUPP: 650.0000 mg | Freq: Four times a day (QID) | RECTAL | Status: DC | PRN

## 2023-08-02 MED ORDER — SODIUM CHLORIDE 0.9% FLUSH
3.0000 mL | Freq: Two times a day (BID) | INTRAVENOUS | Status: DC
  Administered 2023-08-02 – 2023-08-05 (×5): 3 mL via INTRAVENOUS

## 2023-08-02 MED ORDER — ONDANSETRON HCL 4 MG/2ML IJ SOLN: 4.0000 mg | Freq: Four times a day (QID) | INTRAMUSCULAR | Status: DC | PRN

## 2023-08-02 MED ORDER — ACETAMINOPHEN 325 MG PO TABS: 650.0000 mg | ORAL_TABLET | Freq: Four times a day (QID) | ORAL | Status: DC | PRN

## 2023-08-02 MED ORDER — ENOXAPARIN SODIUM 40 MG/0.4ML IJ SOSY
40.0000 mg | PREFILLED_SYRINGE | INTRAMUSCULAR | Status: DC
Start: 2023-08-02 — End: 2023-08-05
  Filled 2023-08-02 (×2): qty 0.4

## 2023-08-02 MED ORDER — OLANZAPINE 10 MG PO TABS
20.0000 mg | ORAL_TABLET | Freq: Every day | ORAL | Status: DC
  Administered 2023-08-02 – 2023-08-04 (×3): 20 mg via ORAL
  Filled 2023-08-02 (×4): qty 2

## 2023-08-02 MED ORDER — ALBUTEROL SULFATE (2.5 MG/3ML) 0.083% IN NEBU: 2.5000 mg | INHALATION_SOLUTION | Freq: Four times a day (QID) | RESPIRATORY_TRACT | Status: DC | PRN

## 2023-08-02 NOTE — ED Notes (Signed)
 IVC paperwork complete and in orange zone, expires 08/08/23, envelope # [id number] (case # will come from clerk of court during the week, hopefully tomorrow)

## 2023-08-02 NOTE — ED Notes (Signed)
 Patient is resting comfortably.
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
## 2023-08-02 NOTE — ED Notes (Signed)
 Pt is awake and desiring to leave. He is more relaxed and seems less guarded and paranoid but needs redirection about leaving.

## 2023-08-02 NOTE — Plan of Care (Signed)

## 2023-08-02 NOTE — ED Notes (Signed)
 Lab called to add on CK

## 2023-08-02 NOTE — ED Notes (Signed)
 Case # [id number]

## 2023-08-02 NOTE — ED Notes (Signed)
Pt changing into purple scrubs.  

## 2023-08-02 NOTE — ED Provider Notes (Signed)
 Patient given in sign out by Burnard Light, PA-C.  Please review their note for patient HPI, physical exam, workup.  At this time the plan is in for fluids, with repeat lactate, osmolality, metabolic panel at 7 AM.  Consults: Shona, MD Hospitalist  I spoke to the hospitalist and patient was accepted for admission.  Patient stable for admission at this time.    Victor Lynwood DASEN, PA-C 08/02/23 9355    Theadore Ozell HERO, MD 08/02/23 [phone]

## 2023-08-02 NOTE — ED Notes (Signed)
 IVC paperwork complete and in orange zone, expires 08/08/23, envelope # [id number] (case # comes from clerk of court)

## 2023-08-02 NOTE — H&P (Addendum)
 History and Physical    Patient: Cesar Robinson FMW:[id number] DOB: 06-28-1981 DOA: 08/01/2023 DOS: the patient was seen and examined on 08/02/2023 PCP: Pcp, No  Patient coming from: Escorted by his per police department  Chief Complaint:  Chief Complaint  Patient presents with   IVC   HPI: Cesar Robinson is a 43 y.o. male with medical history significant of schizophrenia with reported noncompliance with medications presents after being found standing outside on some unknown home.  It was reported initially police tried to transport the patient back to his residence, but the patient did not seem to recognize his twin brother.  Patient was reported to have said that his brother tried to kill his mother.  Patient was exceedingly paranoid with police for which he was brought to the hospital for further evaluation.  History from the patient appears to be unreliable as he mentions that he was outside waiting for a call and currently in the hospital due to sleep apnea.  The patient reports that him and his twin brother do not get along because his mother killed his mother.The patient is a daily smoker, consuming about two Black and Milds per day.  He denied any recent falls, alcohol use, or illicit drug use. The patient did not report any chest pain, abdominal pain, dysuria, numbness, or leg swelling.    The patient's narrative was somewhat disjointed and lacked detail, making it difficult to ascertain the full extent of their current health status. However, they did not report any acute distress or new symptoms.  His mother notes that normally when he is with her he takes his medications every night as he is supposed to, but he had recently been staying with his twin brother for the last couple days and not been on his medications.  In the emergency department patient was noted to be afebrile with pulse elevated up to 115, blood pressures elevated up to 141/91, and all other vital signs maintained.  Labs  from 1/12 significant for WBC 14.7, CO2 15, BUN 24, creatinine 1.04->0.99, anion gap 18->16, calcium  8.3, AST 79->69, lactic acid 1.2,  alcohol level undetectable, salicylate undetectable, acetaminophen  level undetectable, osmolality 311, and CK 3211.   UDS positive for marijuana.  Poison control have been called with recommends including continuing IV fluids, checking CK level which was noted to be elevated, and checking follow-up BMP.  Patient had been given 1 L of normal saline IV fluids, Ativan  2 mg IM, Haldol  5 mg IM, and subsequently Geodon  20 mg IM.  Review of Systems: As mentioned in the history of present illness. All other systems reviewed and are negative. Past Medical History:  Diagnosis Date   Schizophrenia [facility])    History reviewed. No pertinent surgical history. Social History:  reports that he has been smoking cigarettes. He does not have any smokeless tobacco history on file. He reports that he does not currently use alcohol. He reports current drug use. Drug: Marijuana.  No Known Allergies  History reviewed. No pertinent family history.  Prior to Admission medications   Medication Sig Start Date End Date Taking? Authorizing Provider  divalproex  (DEPAKOTE  ER) 500 MG 24 hr tablet Take 1,000 mg by mouth at bedtime.    [provider]  OLANZapine  (ZYPREXA ) 5 MG tablet Take 5 mg by mouth at bedtime.    [provider]  OLANZapine  zydis (ZYPREXA ) 20 MG disintegrating tablet Take 20 mg by mouth at bedtime.    [provider]  Physical Exam: Vitals:   08/01/23 2233 08/02/23 0439  BP: (!) 141/94 109/83  Pulse: (!) 115 95  Resp: 18 19  Temp: 98.7 F (37.1 C) (!) 97.5 F (36.4 C)  TempSrc: Oral Oral  SpO2: 100% 100%    Constitutional: Young male currently in no acute distress Eyes: PERRL, lids and conjunctivae normal ENMT: Mucous membranes are moist. Posterior pharynx clear of any exudate or lesions.Normal dentition.  Neck: normal,  supple Respiratory: clear to auscultation bilaterally, no wheezing, no crackles. Normal respiratory effort. No accessory muscle use.  Cardiovascular: Regular rate and rhythm, no murmurs / rubs / gallops. No extremity edema. 2+ pedal pulses. No carotid bruits.  Abdomen: no tenderness, no masses palpated. No hepatosplenomegaly. Bowel sounds positive.  Musculoskeletal: no clubbing / cyanosis. No joint deformity upper and lower extremities. Good ROM, no contractures. Normal muscle tone.  Skin: no rashes, lesions, ulcers. No induration Neurologic: CN 2-12 grossly intact. Sensation intact, DTR normal. Strength 5/5 in all 4.  Psychiatric: Alert and oriented x 3.  Seems anxious and intermittently paranoid  Data Reviewed:  EKG positive for sinus tachycardia 107 bpm with rightward axis deviation.  Reviewed labs, imaging, and pertinent records as documented. Assessment and Plan:  Rhabdomyolysis Acute.  Patient was found to have elevated CK of 3211. The elevated BUN to creatinine ratio suggest dehydration.  Patient had been bolused 1 L of saline IV fluids.  Possibly related with patient's acute psychoses. -Admit to a medical telemetry bed -Monitor intake and output -Bedrest   -Check urinalysis -Lactated Ringer's at 200 mL/h x 1 day -Recheck CK and CMP in a.m.  SIRS On admission patient was noted to be tachycardic with white blood cell count elevated up to 14.7 meeting SIRS criteria.  Patient was otherwise noted to be afebrile.  Lung sounds clear. -Add-on ESR, CRP, and procalcitonin -Check urinalysis -Check chest x-ray  Involuntary commitment Psychotic episode (Acute) Schizophrenia Patient noted to have not recognized this to her brother as well as reporting that he was trying to kill the mother.  He was involuntary commitmented.  In the ED patient had received Ativan  2 mg IM, Haldol  5 mg IM, and subsequently Geodon  20 mg IM. -Continue one-to-one monitoring for involuntary commitment -Check  Depakote  level -Continue Depakote  and olanzapine  nightly -Consult inpatient psychiatry for possible behavioral health transfer once medically stable  Metabolic acidosis with increased anion gap Acute.  CO2 noted to be as low as 15 with initial anion gap of 18.  Repeat labs note gap decreasing to 16 but sodium bicarb still at 15.  Salicylate, acetaminophen , and alcohol level undetectable. -Continue to monitor  Elevated AST Acute.  AST was elevated at 79 with repeat check 69.  This can be associated with rhabdomyolysis. -Add-on hepatitis panel -Follow-up CMP tomorrow  Marijuana and tobacco use Patient reports smoking cigarettes.  UDS positive for marijuana.  Unclear at this time if this or some unknown substances also factoring into patient's acute psychosis. -Continue to counsel on need of cessation of marijuana and tobacco use  -Nicotine  patch offered  DVT prophylaxis: Lovenox  Advance Care Planning:   Code Status: Full Code    Consults: none  Family Communication: Patient's mother and other family updated at bedside  Severity of Illness: The appropriate patient status for this patient is INPATIENT. Inpatient status is judged to be reasonable and necessary in order to provide the required intensity of service to ensure the patient's safety. The patient's presenting symptoms, physical exam findings, and initial radiographic and laboratory  data in the context of their chronic comorbidities is felt to place them at high risk for further clinical deterioration. Furthermore, it is not anticipated that the patient will be medically stable for discharge from the hospital within 2 midnights of admission.    * I certify that at the point of admission it is my clinical judgment that the patient will require inpatient hospital care spanning beyond 2 midnights from the point of admission due to high intensity of service, high risk for further deterioration and high frequency of surveillance  required.*  Author: Maximino DELENA Sharps, MD 08/02/2023 7:22 AM  For on call review [email or web address].

## 2023-08-03 DIAGNOSIS — F200 Paranoid schizophrenia: Secondary | ICD-10-CM | POA: Diagnosis not present

## 2023-08-03 DIAGNOSIS — M6282 Rhabdomyolysis: Secondary | ICD-10-CM

## 2023-08-03 DIAGNOSIS — E8729 Other acidosis: Secondary | ICD-10-CM

## 2023-08-03 LAB — COMPREHENSIVE METABOLIC PANEL
ALT: 19 U/L (ref 0–44)
AST: 43 U/L — ABNORMAL HIGH (ref 15–41)
Albumin: 2.6 g/dL — ABNORMAL LOW (ref 3.5–5.0)
Alkaline Phosphatase: 38 U/L (ref 38–126)
Anion gap: 6 (ref 5–15)
BUN: 5 mg/dL — ABNORMAL LOW (ref 6–20)
CO2: 24 mmol/L (ref 22–32)
Calcium: 8.4 mg/dL — ABNORMAL LOW (ref 8.9–10.3)
Chloride: 111 mmol/L (ref 98–111)
Creatinine, Ser: 0.74 mg/dL (ref 0.61–1.24)
GFR, Estimated: >60 mL/min (ref >60)
Glucose, Bld: 83 mg/dL (ref 70–99)
Potassium: 3.8 mmol/L (ref 3.5–5.1)
Sodium: 141 mmol/L (ref 135–145)
Total Bilirubin: 0.7 mg/dL (ref 0.0–1.2)
Total Protein: 4.9 g/dL — ABNORMAL LOW (ref 6.5–8.1)

## 2023-08-03 LAB — CBC
HCT: 29 % — ABNORMAL LOW (ref 39.0–52.0)
Hemoglobin: 10 g/dL — ABNORMAL LOW (ref 13.0–17.0)
MCH: 32.5 pg (ref 26.0–34.0)
MCHC: 34.5 g/dL (ref 30.0–36.0)
MCV: 94.2 fL (ref 80.0–100.0)
Platelets: 158 10*3/uL (ref 150–400)
RBC: 3.08 MIL/uL — ABNORMAL LOW (ref 4.22–5.81)
RDW: 14.7 % (ref 11.5–15.5)
WBC: 8.1 10*3/uL (ref 4.0–10.5)
nRBC: 0 % (ref 0.0–0.2)

## 2023-08-03 LAB — CK: Total CK: 1692 U/L — ABNORMAL HIGH (ref 49–397)

## 2023-08-03 NOTE — Consult Note (Addendum)
 [facility] Psychiatric Consult Initial  Patient Name: .Cesar Robinson  MRN: [id number]  DOB: [birth year]  Consult Order details: medically stable for dc to inpt psychiatry/Eric Austria DO Orders (From admission, onward)     Start     Ordered   08/03/23 1051  IP CONSULT TO PSYCHIATRY       Comments: Medically stable for discharge to inpatient psychiatry  Ordering Provider: Austria, Eric J, DO  Provider:  (Not yet assigned)  Question Answer Comment  Location [facility]   Reason for Consult? Acute psychosis in the setting of schizophrenia      08/03/23 1050             Mode of Visit: In person    Psychiatry Consult Evaluation  Service Date: August 03, 2023 LOS:  LOS: 1 day  Chief Complaint I have sleep apnea  Primary Psychiatric Diagnoses  Schizophrenia, paranoid  Assessment  Cesar Robinson is a 43 y.o. male admitted: Medicallyfor 08/01/2023 10:12 PM for exposure to the elements. He carries the psychiatric diagnoses of schizophrenia and has a past medical history of  sleep apnea.   His current presentation of disorganization and overt paranoia is most consistent with known diagnosis of schizophrenia. U/a was also + for [facility] - unclear if chronic use or part of what precipitated admission. Current outpatient psychotropic medications include zyprexa  and depakote  and historically he has had a good response to these medications - per collateral his issue is nonadherence > treatment resistance. He was NOT compliant with medications prior to admission as evidenced by Dr. Theressa excellent H&P. On initial examination, patient was minimally cooperative and paranoid. Please see plan below for detailed recommendations.   Diagnoses:  Active Hospital problems: Principal Problem:   Rhabdomyolysis Active Problems:   Psychotic episode (HCC)   SIRS (systemic inflammatory response syndrome) (HCC)   Schizophrenia (HCC)   Involuntary commitment   Elevated AST (SGOT)    Metabolic acidosis with increased anion gap and accumulation of organic acids   Marijuana use    Plan   ## Psychiatric Medication Recommendations:  -- restart home meds (already done) -- VPA 1000 mg/d -- olanzapine  20 mg/night   ## Medical Decision Making Capacity: Not specifically addressed in this encounter  ## Further Work-up:  -- reordered EKG (qtc) While pt on Qtc prolonging medications, please monitor & replete K+ to 4 and Mg2+ to 2 -- most recent EKG on 1/13 had QtC of 483 in setting of sinus tach -- Pertinent labwork reviewed earlier this admission includes: UDS + THC, VPA undetectable. Elevated CK now downtrending no suspicion at this time for NMS (nonadherent, temps all wnl, etc)    ## Disposition:-- We recommend inpatient psychiatric hospitalization after medical hospitalization. Patient has been involuntarily committed on 1/12.   ## Behavioral / Environmental: -Utilize compassion and acknowledge the patient's experiences while setting clear and realistic expectations for care.    ## Safety and Observation Level:  - Based on my clinical evaluation, I estimate the patient to be at low/moderate risk of self harm in the current setting. - At this time, we recommend  1:1 Observation due to elopement risk and institutional policy around IVC'd pts  This decision is based on my review of the chart including patient's history and current presentation, interview of the patient, mental status examination, and consideration of suicide risk including evaluating suicidal ideation, plan, intent, suicidal or self-harm behaviors, risk factors, and protective factors. This judgment is based on our ability to  directly address suicide risk, implement suicide prevention strategies, and develop a safety plan while the patient is in the clinical setting. Please contact our team if there is a concern that risk level has changed.  CSSR Risk Category:C-SSRS RISK CATEGORY: No Risk  Suicide Risk  Assessment: Patient has following modifiable risk factors for suicide: medication noncompliance, which we are addressing by restarting previously effective medications. Patient has following non-modifiable or demographic risk factors for suicide: male gender Patient has the following protective factors against suicide: Supportive family  Thank you for this consult request. Recommendations have been communicated to the primary team.  We will continue to follow at this time.   [name]       History of Present Illness  Relevant Aspects of Hospital Hospital Course:  Admitted on 08/01/2023 for essentially exposure to the elements during psychotic break; now medically clear.   Patient Report:  Patient suspects he is hospitalized due to sleep apnea. Patient remembers talking with the police prior to hospitalization but is unsure about what. Reports brother is just a friend you wouldn't understand.   Sees psychiatrist for schizophrenia and sleep issues. Patient non-compliant on Depakote  and Zyprexa . Denies SI, HI, AVH. Denies any side effects from taking Depakote  and Zyprexa . Patient unsure about restarting meds., Patient does not want to go to psychiatric hospitalization but unable to really explain reasoning.  Notably, his sister called midway through interview. He stated (again) that she wasn't his sister and refused to speak to her - sister notified that if we got permission to speak to family we would call. He did not give us  permission to call family. He asked about being seen again later - let him know would come back more likely tomorrow than later today.   Psych ROS:  Depression: Did not meaningfully engage Anxiety:  Did not meaningfully engage Mania (lifetime and current): Did not meaningfully engage Psychosis: (lifetime and current): Clear paranoia, delusions  Collateral information:  Told nurse his name was Debbra and that he was not related to his twin brother Carliss.    Select info from Dr. Theressa H&P:  His mother notes that normally when he is with her he takes his medications every night as he is supposed to, but he had recently been staying with his twin brother for the last couple days and not been on his medications.   The patient reports that him and his twin brother do not get along because his mother killed his mother.The patient is a daily smoker, consuming about two Black and Milds per day.   It was reported initially police tried to transport the patient back to his residence, but the patient did not seem to recognize his twin brother.  Patient was reported to have said that his brother tried to kill his mother.  Patient was exceedingly paranoid with police for which he was brought to the hospital for further evaluation.   Review of Systems  Reason unable to perform ROS: pt minimally engaged.     Psychiatric and Social History  Psychiatric History:  Information collected from medical record   Prev Dx/Sx: Schizophrenia  Current Psych Provider: Alemu Mengistu Home Meds (current): Depakote  and Zyprexa   Previous Med Trials: unknown Therapy: unknown  Prior Psych Hospitalization: likely although unknown   Prior Self Harm: unknown Prior Violence: unknown  Family Psych History: unknown  Family Hx suicide: unknown  Social History:  Developmental Hx: unknown Educational Hx: unknown Occupational Hx: unknown Legal Hx: unknown Living Situation: usually  with mother, recently with brother Spiritual Hx: unknown Access to weapons/lethal means: unknown   Substance History Note: EMR does not hve red flags for EtOH, prescription drugs, etc.  Alcohol: unknown  Type of alcohol unknown Last Drink unknown Number of drinks per day unknown History of alcohol withdrawal seizures unknown History of DT's unknown Tobacco: 2 black and milds a day  Illicit drugs: THC - unclear if chronic or acute  Prescription drug abuse: unclear Rehab hx:  unclear  Exam Findings  Physical Exam:  Vital Signs:  Temp:  [98.2 F (36.8 C)-98.6 F (37 C)] 98.2 F (36.8 C) (01/14 0439) Pulse Rate:  [80-110] 94 (01/14 0439) Resp:  [11-20] 18 (01/14 0439) BP: (114-136)/(76-89) 114/77 (01/14 0439) SpO2:  [100 %] 100 % (01/14 0439) Blood pressure 114/77, pulse 94, temperature 98.2 F (36.8 C), temperature source Oral, resp. rate 18, height 5' 10 (1.778 m), weight 58.1 kg, SpO2 100%. Body mass index is 18.38 kg/m.  Physical Exam HENT:     Head: Normocephalic.  Pulmonary:     Effort: Pulmonary effort is normal.     Comments: No apneic episodes noted.  Neurological:     Mental Status: He is alert.     Mental Status Exam: General Appearance: Casual  Orientation:  Other:  Self and location. Not to situation.   Memory:   Difficult to assess - seems more paranoia than memory deficits  Concentration:  Concentration: Fair  Recall:  Fair  Attention  Fair  Eye Contact:  Poor - kept closing eyes and stating I am having sleep apnea rather than participating.   Speech:  Clear and Coherent  Language:  Good  Volume:  Decreased  Mood: I am fine  Affect:  Blunt  Thought Process:  Disorganized  Thought Content:  Delusions and Paranoid Ideation  Suicidal Thoughts:  No  Homicidal Thoughts:  No  Judgement:  Poor  Insight:  Lacking  Psychomotor Activity:  Decreased  Akathisia:  No  Fund of Knowledge:  Fair      Assets:  Social Support  Cognition:  Impared d/t psychotic illness    Other History   These have been pulled in through the EMR, reviewed, and updated if appropriate.  Family History:  The patient's family history is not on file.  Medical History: Past Medical History:  Diagnosis Date  . Schizophrenia (HCC)   . Sleep apnea     Surgical History: History reviewed. No pertinent surgical history.   Medications:   Current Facility-Administered Medications:  .  acetaminophen  (TYLENOL ) tablet 650 mg, 650 mg, Oral, Q6H PRN  **OR** acetaminophen  (TYLENOL ) suppository 650 mg, 650 mg, Rectal, Q6H PRN, Smith, Rondell A, MD .  albuterol  (PROVENTIL ) (2.5 MG/3ML) 0.083% nebulizer solution 2.5 mg, 2.5 mg, Nebulization, Q6H PRN, Claudene, Rondell A, MD .  divalproex  (DEPAKOTE  ER) 24 hr tablet 1,000 mg, 1,000 mg, Oral, QHS, Smith, Rondell A, MD, 1,000 mg at 08/02/23 2214 .  enoxaparin  (LOVENOX ) injection 40 mg, 40 mg, Subcutaneous, Q24H, Smith, Rondell A, MD .  nicotine  (NICODERM CQ  - dosed in mg/24 hours) patch 14 mg, 14 mg, Transdermal, Daily, Claudene, Rondell A, MD, 14 mg at 08/03/23 0933 .  OLANZapine  (ZYPREXA ) tablet 20 mg, 20 mg, Oral, QHS, Smith, Rondell A, MD, 20 mg at 08/02/23 2214 .  ondansetron  (ZOFRAN ) tablet 4 mg, 4 mg, Oral, Q6H PRN **OR** ondansetron  (ZOFRAN ) injection 4 mg, 4 mg, Intravenous, Q6H PRN, Smith, Rondell A, MD .  sodium chloride  flush (NS) 0.9 % injection 3  mL, 3 mL, Intravenous, Q12H, Smith, Rondell A, MD, 3 mL at 08/02/23 2215  Allergies: No Known Allergies  Rollene A Dewarren Ledbetter

## 2023-08-03 NOTE — TOC Progression Note (Signed)
 Transition of Care [facility]) - Progression Note    Patient Details  Name: Cesar Robinson MRN: [id number] Date of Birth: [birth year]  [facility] CM/SW Contact  Dalana Pfahler A Rashida Ladouceur, [place] Phone Number: 08/03/2023, 5:14 PM  Clinical Narrative:     CSW faxed pt out for inpatient psychiatric beds. No bed offers yet.   CSW will continue to follow.          Expected Discharge Plan and Services                                               Social Determinants of Health (SDOH) Interventions SDOH Screenings   Food Insecurity: Food Insecurity Present (08/02/2023)  Housing: Low Risk  (08/02/2023)  Transportation Needs: No Transportation Needs (08/02/2023)  Utilities: Not At Risk (08/02/2023)  Tobacco Use: High Risk (08/02/2023)    Readmission Risk Interventions     No data to display

## 2023-08-03 NOTE — Progress Notes (Signed)
 PROGRESS NOTE    Cesar Robinson  FMW:[id number] DOB: [birth year] DOA: 08/01/2023 PCP: Freddrick, No    Brief Narrative:   Cesar Robinson is a 43 y.o. male with past medical history significant for schizophrenia with medical noncompliance who presents to [facility] ED on 1/12 via law enforcement due to paranoid behaviors.  Patient apparently was found standing outside on the lawn of unknown home.  Please try to transport patient back to his residence and he did not recognize his twin brother and reported that his brother tried to kill his mother.  Patient had been exceedingly paranoid with police and difficult to redirect.  And given his behavior, [employer] transported patient to the hospital for further evaluation and management.  The patient's narrative was somewhat disjointed and lacked detail, making it difficult to ascertain the full extent of their current health status. However, they did not report any acute distress or new symptoms.  His mother notes that normally when he is with her he takes his medications every night as he is supposed to, but he had recently been staying with his twin brother for the last couple days and not been on his medications.   Patient reported that him and his twin brother do not get along because his brother killed his mother.  Endorses daily tobacco use, denies recent falls, no alcohol use or illicit drug use.  Did not report any chest pain, no abdominal pain, no dysuria, no paresthesias or lower extremity edema.  In the ED, temperature 98.7 F, HR 115, RR 18, BP 141/94, SpO2 100% on room air.  WBC 14.7, hemoglobin 13.3, platelet count 220.  Sodium 141, potassium 3.9, chloride 108, CO2 15, glucose 86, BUN 24, creat 1.04.  AST 79, ALT 27, total bilirubin 1.2.  CK 3211.  Procalcitonin 0.10.  Lactic acid 1.2.  Urinalysis unrevealing.  EtOH level less than 10.  Salicylate level less than 7.0.  UDS positive for THC.  Chest x-ray with no active cardiopulmonary disease process. Patient had  been given 1 L of normal saline IV fluids, Ativan  2 mg IM, Haldol  5 mg IM, and subsequently Geodon  20 mg IM.  TRH consulted for admission.  Assessment & Plan:   Rhabdomyolysis, mild CK level elevated 3211.  Etiology likely secondary to dehydration in the setting of elevated BUN to creatinine ratio; complicated by his acute psychosis.  Patient was given 1 L IV fluid bolus.  Denies myalgias -- CK [phone] -- No further IV fluids needed, encourage increased oral intake  Leukocytosis, likely reactive: Resolved Patient was noted to be tachycardic, elevated WBC count of 115.  Etiology likely secondary to dehydration/acute psychosis with reactive leukocytosis.  Procalcitonin reassuring.  Urinalysis with 5 ketones, otherwise negative.  Chest x-ray with no active cardiopulmonary disease process.  Patient was treated with 1 L IV fluid bolus with resolution of elevated WBC count -- WBC 14.7>8.1  Acute psychosis under IVC Hx schizophrenia Medical noncompliance Patient was noted to not recognize his twin brother and reporting that his brother was trying to kill his mother.  Patient was placed under IVC and transported the ED by [employer].  In the ED patient received Ativan  2 mg IM, Haldol  5 mg IM and subsequently Geodon  20 mg IM. -- Psychiatry consulted -- Valproic acid  level less than 10 -- Depakote  1 g p.o. nightly -- Pradaxa 20 mg p.o. nightly -- Continue IVC, sitter -- Medically stable to discharge to inpatient psych  Anion gap metabolic acidosis CO2 15 with initial anion  gap 18 on admission.  Salicylate, acetaminophen  and alcohol level undetectable.  Urinalysis with 5 ketones.  Etiology likely secondary to dehydration with elevated BUN of 24 on admission versus starvation ketosis. Supported with IV fluid hydration with resolution.   Elevated LFTs: Improving Acute hepatitis panel negative.  Salicylate/acetaminophen  level negative.  EtOH level less than 10.  Etiology likely secondary to  dehydration/mild rhabdomyolysis. -- AST 79>69>43 -- ALT 27>23>19 -- Tbili 1.2>>0.7  Marijuana and tobacco use Patient reports smoking cigarettes.  UDS positive for marijuana.  Unclear at this time if this or some unknown substances also factoring into patient's acute psychosis. -- Counseled on need of cessation of marijuana and tobacco use    DVT prophylaxis: enoxaparin  (LOVENOX ) injection 40 mg Start: 08/02/23 0800    Code Status: Full Code Family Communication: No family present at bedside this morning  Disposition Plan:  Level of care: Telemetry Medical Status is: Inpatient Remains inpatient appropriate because: Transfer to inpatient psychiatry    Consultants:  Psychiatry  Procedures:  None  Antimicrobials:  None   Subjective: Patient seen examined bedside, lying in bed.  Sleeping but easily arousable.  Sitter present at bedside.  No complaints this morning.  Endorses that he takes Depakote .  Denies any current auditory or visual hallucinations, denies SI/HI.  LFTs improved, metabolic acidosis and leukocytosis has resolved.  No other specific complaints, questions or concerns at this time.  Denies headache, no chest pain, no shortness of breath, no abdominal pain, no myalgias.  Refusing Lovenox , otherwise no acute events overnight per nursing staff.  Psychiatry consulted this morning, patient medically stable for transfer to inpatient psych once bed available.  Objective: Vitals:   08/02/23 1630 08/02/23 2214 08/03/23 0031 08/03/23 0439  BP: 120/77 132/89 122/79 114/77  Pulse: 94 90 80 94  Resp: 17 20 18 18   Temp: 98.5 F (36.9 C) 98.6 F (37 C) 98.4 F (36.9 C) 98.2 F (36.8 C)  TempSrc: Oral Oral Oral Oral  SpO2: 100% 100% 100% 100%  Weight:      Height:        Intake/Output Summary (Last 24 hours) at 08/03/2023 1051 Last data filed at 08/02/2023 2351 Gross per 24 hour  Intake 1293 ml  Output --  Net 1293 ml   Filed Weights   08/02/23 1106  Weight:  58.1 kg    Examination:  Physical Exam: GEN: NAD, alert and oriented x 3, wd/wn HEENT: NCAT, PERRL, EOMI, sclera clear, MMM PULM: CTAB w/o wheezes/crackles, normal respiratory effort, on room air CV: RRR w/o M/G/R GI: abd soft, NTND, NABS, no R/G/M MSK: no peripheral edema, moves all extremities independently NEURO: No focal neurological deficits PSYCH: Denies SI/HI, no auditory/visual hallucinations Integumentary: No concerning rashes/lesions/wounds noted on exposed skin surfaces    Data Reviewed: I have personally reviewed following labs and imaging studies  CBC: Recent Labs  Lab 08/01/23 2240 08/03/23 0534  WBC 14.7* 8.1  NEUTROABS 11.6*  --   HGB 13.3 10.0*  HCT 40.2 29.0*  MCV 97.3 94.2  PLT 220 158   Basic Metabolic Panel: Recent Labs  Lab 08/01/23 2240 08/02/23 0258 08/03/23 0534  NA 141 137 141  K 3.9 4.3 3.8  CL 108 106 111  CO2 15* 15* 24  GLUCOSE 86 70 83  BUN 24* 22* 5*  CREATININE 1.04 0.99 0.74  CALCIUM  9.8 8.3* 8.4*   GFR: Estimated Creatinine Clearance: 97.8 mL/min (by C-G formula based on SCr of 0.74 mg/dL). Liver Function Tests: Recent Labs  Lab 08/01/23 2240 08/02/23 0258 08/03/23 0534  AST 79* 69* 43*  ALT 27 23 19   ALKPHOS 61 52 38  BILITOT 1.2 1.1 0.7  PROT 8.1 6.7 4.9*  ALBUMIN 4.8 3.8 2.6*   No results for input(s): LIPASE, AMYLASE in the last 168 hours. No results for input(s): AMMONIA in the last 168 hours. Coagulation Profile: No results for input(s): INR, PROTIME in the last 168 hours. Cardiac Enzymes: Recent Labs  Lab 08/02/23 0258 08/03/23 0534  CKTOTAL 3,211* 1,692*   BNP (last 3 results) No results for input(s): PROBNP in the last 8760 hours. HbA1C: No results for input(s): HGBA1C in the last 72 hours. CBG: No results for input(s): GLUCAP in the last 168 hours. Lipid Profile: No results for input(s): CHOL, HDL, LDLCALC, TRIG, CHOLHDL, LDLDIRECT in the last 72 hours. Thyroid   Function Tests: No results for input(s): TSH, T4TOTAL, FREET4, T3FREE, THYROIDAB in the last 72 hours. Anemia Panel: No results for input(s): VITAMINB12, FOLATE, FERRITIN, TIBC, IRON, RETICCTPCT in the last 72 hours. Sepsis Labs: Recent Labs  Lab 08/02/23 0003 08/02/23 1610  PROCALCITON  --  0.10  LATICACIDVEN 1.2  --     No results found for this or any previous visit (from the past 240 hours).       Radiology Studies: DG Chest 1 View Result Date: 08/02/2023 CLINICAL DATA:  [id number] SIRS (systemic inflammatory response syndrome) (HCC) [id number] EXAM: CHEST  1 VIEW COMPARISON:  04/09/2015. FINDINGS: Bilateral lung fields are clear. Bilateral costophrenic angles are clear. Normal cardio-mediastinal silhouette. No acute osseous abnormalities. The soft tissues are within normal limits. IMPRESSION: No active disease. Electronically Signed   By: Ree Molt M.D.   On: 08/02/2023 08:23        Scheduled Meds:  divalproex   1,000 mg Oral QHS   enoxaparin  (LOVENOX ) injection  40 mg Subcutaneous Q24H   nicotine   14 mg Transdermal Daily   OLANZapine   20 mg Oral QHS   sodium chloride  flush  3 mL Intravenous Q12H   Continuous Infusions:   LOS: 1 day    Time spent: 52 minutes spent on chart review, discussion with nursing staff, consultants, updating family and interview/physical exam; more than 50% of that time was spent in counseling and/or coordination of care.    Camellia PARAS Ramiyah Mcclenahan, DO [facility] Available via Epic secure chat 7am-7pm After these hours, please refer to coverage provider listed on [email or web address] 08/03/2023, 10:51 AM

## 2023-08-03 NOTE — Plan of Care (Signed)

## 2023-08-04 DIAGNOSIS — F200 Paranoid schizophrenia: Secondary | ICD-10-CM | POA: Diagnosis not present

## 2023-08-04 DIAGNOSIS — M6282 Rhabdomyolysis: Secondary | ICD-10-CM | POA: Diagnosis not present

## 2023-08-04 NOTE — Consult Note (Signed)
 [facility] Psychiatric Consult Initial  Patient Name: .Cesar Robinson  MRN: [id number]  DOB: November 13, 1980  Consult Order details: medically stable for dc to inpt psychiatry/Eric [place] DO   Mode of Visit: In person    Psychiatry Consult Evaluation  Service Date: August 04, 2023 LOS:  LOS: 2 days  Chief Complaint "I have sleep apnea"  Primary Psychiatric Diagnoses  Schizophrenia, paranoid  Assessment  HISAO Cesar Robinson is a 43 y.o. male admitted: Medicallyfor 08/01/2023 10:12 PM for exposure to the elements. He carries the psychiatric diagnoses of schizophrenia and has a past medical history of  sleep apnea.   His current presentation of disorganization and overt paranoia is most consistent with known diagnosis of schizophrenia. U/a was also + for [facility] - unclear if chronic use or part of what precipitated admission. Current outpatient psychotropic medications include zyprexa  and depakote  and historically he has had a good response to these medications - per collateral his issue is nonadherence > treatment resistance. He was NOT compliant with medications prior to admission as evidenced by Dr. Shirlie Dove excellent H&P. On initial examination, patient was minimally cooperative and paranoid. Please see plan below for detailed recommendations.   1/15: marginal improvement in delusions. Still no permissiont o call family  Diagnoses:  Active Hospital problems: Principal Problem:   Rhabdomyolysis Active Problems:   Psychotic episode (HCC)   SIRS (systemic inflammatory response syndrome) (HCC)   Schizophrenia (HCC)   Involuntary commitment   Elevated AST (SGOT)   Metabolic acidosis with increased anion gap and accumulation of organic acids   Marijuana use    Plan   ## Psychiatric Medication Recommendations:  -- restart home meds (already done) -- VPA 1000 mg/d -- olanzapine  20 mg/night   ## Medical Decision Making Capacity: Not specifically addressed in this encounter  ## Further  Work-up:  -- reordered EKG (qtc) - 450 1/14 -- ordered VPA for AM 1/16 - please f/u   While pt on Qtc prolonging medications, please monitor & replete K+ to 4 and Mg2+ to 2 -- most recent EKG on 1/13 had QtC of 483 in setting of sinus tach -- Pertinent labwork reviewed earlier this admission includes: UDS + THC, VPA undetectable. Elevated CK now downtrending no suspicion at this time for NMS (nonadherent, temps all wnl, etc)    ## Disposition:-- We recommend inpatient psychiatric hospitalization after medical hospitalization. Patient has been involuntarily committed on 1/12.   ## Behavioral / Environmental: -Utilize compassion and acknowledge the patient's experiences while setting clear and realistic expectations for care.    ## Safety and Observation Level:  - Based on my clinical evaluation, I estimate the patient to be at low/moderate risk of self harm in the current setting. - At this time, we recommend  1:1 Observation due to elopement risk and institutional policy around IVC'd pts  This decision is based on my review of the chart including patient's history and current presentation, interview of the patient, mental status examination, and consideration of suicide risk including evaluating suicidal ideation, plan, intent, suicidal or self-harm behaviors, risk factors, and protective factors. This judgment is based on our ability to directly address suicide risk, implement suicide prevention strategies, and develop a safety plan while the patient is in the clinical setting. Please contact our team if there is a concern that risk level has changed.  CSSR Risk Category:C-SSRS RISK CATEGORY: No Risk  Suicide Risk Assessment: Patient has following modifiable risk factors for suicide: medication noncompliance, which we are addressing by restarting  previously effective medications. Patient has following non-modifiable or demographic risk factors for suicide: male gender Patient has the  following protective factors against suicide: Supportive family  Thank you for this consult request. Recommendations have been communicated to the primary team.  We will continue to follow at this time.   Bethany Cumming A Haunani Dickard       History of Present Illness  Relevant Aspects of Hospital Hospital Course:  Admitted on 08/01/2023 for essentially exposure to the elements during psychotic break; now medically clear.   Patient Report:  Pt seen in late afternoon. Has spent the day watching TV and sleeping.  He now states he missed a few medications. Delusions/paranoia less evident - did not challenge me when I referred to his family as brother, sister, mom, etc. Challenged some aspects of admission. He denied any s/e to restarting medications. Continues to refuse permission to call collateral. I did let him know that his family was worried about him, calling hte hospital asking about him, and they would need to be contacted at some point for him to get out of here.  Denies SI, HI, AVH. Denies any side effects from taking Depakote  and Zyprexa . Patient unsure about restarting meds., Patient does not want to go to psychiatric hospitalization but unable to really explain reasoning similar to yesterday.  Psych ROS:  Depression: Did not meaningfully engage Anxiety:  Did not meaningfully engage Mania (lifetime and current): Did not meaningfully engage Psychosis: (lifetime and current): Delusions, paranoia less evident.   Collateral information:  Told nurse his name was "Cesar Robinson" and that he was not related to his twin brother Lenette Quick.   Select info from Dr. Shirlie Dove H&P:  His mother notes that normally when he is with her he takes his medications every night as he is supposed to, but he had recently been staying with his twin brother for the last couple days and not been on his medications.   The patient reports that him and his twin brother do not get along because his mother killed his mother.The patient is  a daily smoker, consuming about two Black and Milds per day.   It was reported initially police tried to transport the patient back to his residence, but the patient did not seem to recognize his twin brother.  Patient was reported to have said that his brother tried to kill his mother.  Patient was exceedingly paranoid with police for which he was brought to the hospital for further evaluation.   Review of Systems  Reason unable to perform ROS: pt minimally engaged.     Psychiatric and Social History  Psychiatric History:  Information collected from medical record   Prev Dx/Sx: Schizophrenia  Current Psych Provider: Alemu Mengistu Home Meds (current): Depakote  and Zyprexa   Previous Med Trials: unknown Therapy: unknown  Prior Psych Hospitalization: likely although unknown   Prior Self Harm: unknown Prior Violence: unknown  Family Psych History: unknown  Family Hx suicide: unknown  Social History:  Developmental Hx: unknown Educational Hx: unknown Occupational Hx: unknown Legal Hx: unknown Living Situation: usually with mother, recently with brother Spiritual Hx: unknown Access to weapons/lethal means: unknown   Substance History Note: EMR does not hve red flags for EtOH, prescription drugs, etc.  Alcohol: unknown  Type of alcohol unknown Last Drink unknown Number of drinks per day unknown History of alcohol withdrawal seizures unknown History of DT's unknown Tobacco: 2 black and milds a day  Illicit drugs: THC - unclear if chronic or acute  Prescription drug  abuse: unclear Rehab hx: unclear  Exam Findings  Physical Exam:  Vital Signs:  Temp:  [98.2 F (36.8 C)-98.3 F (36.8 C)] 98.3 F (36.8 C) (01/15 0859) Pulse Rate:  [66-86] 85 (01/15 0859) Resp:  [16-18] 16 (01/15 0859) BP: (111-124)/(78-88) 119/79 (01/15 0859) SpO2:  [98 %-100 %] 98 % (01/15 0859) Blood pressure 119/79, pulse 85, temperature 98.3 F (36.8 C), temperature source Oral, resp. rate 16,  height 5\' 10"  (1.778 m), weight 58.1 kg, SpO2 98%. Body mass index is 18.38 kg/m.  Physical Exam HENT:     Head: Normocephalic.  Pulmonary:     Effort: Pulmonary effort is normal.     Comments: No apneic episodes noted.  Neurological:     Mental Status: He is alert.     Mental Status Exam: General Appearance: Casual  Orientation:  Other:  Self and location. Not to situation.   Memory:   Difficult to assess - seems more paranoia than memory deficits  Concentration:  Concentration: Fair  Recall:  Fair  Attention  Fair  Eye Contact:  Fair   Speech:  Clear and Coherent  Language:  Good  Volume:  Decreased  Mood: "I am fine"  Affect:  Blunt  Thought Process:  Disorganized  Thought Content:  Delusions and Paranoid Ideation, better   Suicidal Thoughts:  No  Homicidal Thoughts:  No  Judgement:  Poor  Insight:  Lacking  Psychomotor Activity:  Decreased  Akathisia:  No  Fund of Knowledge:  Fair      Assets:  Social Support  Cognition:  Impared d/t psychotic illness    Other History   These have been pulled in through the EMR, reviewed, and updated if appropriate.  Family History:  The patient's family history is not on file.  Medical History: Past Medical History:  Diagnosis Date   Schizophrenia [facility])    Sleep apnea     Surgical History: History reviewed. No pertinent surgical history.   Medications:   Current Facility-Administered Medications:    acetaminophen  (TYLENOL ) tablet 650 mg, 650 mg, Oral, Q6H PRN **OR** acetaminophen  (TYLENOL ) suppository 650 mg, 650 mg, Rectal, Q6H PRN, Felipe Horton, Rondell A, MD   albuterol  (PROVENTIL ) (2.5 MG/3ML) 0.083% nebulizer solution 2.5 mg, 2.5 mg, Nebulization, Q6H PRN, Felipe Horton, Rondell A, MD   divalproex  (DEPAKOTE  ER) 24 hr tablet 1,000 mg, 1,000 mg, Oral, QHS, Smith, Rondell A, MD, 1,000 mg at 08/03/23 2115   enoxaparin  (LOVENOX ) injection 40 mg, 40 mg, Subcutaneous, Q24H, Smith, Rondell A, MD   nicotine  (NICODERM CQ  - dosed in  mg/24 hours) patch 14 mg, 14 mg, Transdermal, Daily, Felipe Horton, Rondell A, MD, 14 mg at 08/03/23 1610   OLANZapine  (ZYPREXA ) tablet 20 mg, 20 mg, Oral, QHS, Smith, Rondell A, MD, 20 mg at 08/03/23 2116   ondansetron  (ZOFRAN ) tablet 4 mg, 4 mg, Oral, Q6H PRN **OR** ondansetron  (ZOFRAN ) injection 4 mg, 4 mg, Intravenous, Q6H PRN, Smith, Rondell A, MD   sodium chloride  flush (NS) 0.9 % injection 3 mL, 3 mL, Intravenous, Q12H, Smith, Rondell A, MD, 3 mL at 08/04/23 0804  Allergies: No Known Allergies  Daivd Dub A Masami Plata

## 2023-08-04 NOTE — Progress Notes (Signed)
 PROGRESS NOTE    Cesar Robinson  ZHY:[id number] DOB: 01-Jan-1981 DOA: 08/01/2023 PCP: Vicente Graham, No    Brief Narrative:   Cesar Robinson is a 43 y.o. male with past medical history significant for schizophrenia with medical noncompliance who presents to [facility] ED on 1/12 via law enforcement due to paranoid behaviors.  Patient apparently was found standing outside on the lawn of unknown home.  Please try to transport patient back to Cesar residence and he did not recognize Cesar Robinson and reported that "Cesar Robinson tried to kill Cesar mother".  Patient had been exceedingly paranoid with police and difficult to redirect.  And given Cesar behavior, [employer] transported patient to the hospital for further evaluation and management.  The patient's narrative was somewhat disjointed and lacked detail, making it difficult to ascertain the full extent of their current health status. However, they did not report any acute distress or new symptoms.  Cesar mother notes that normally when he is with her he takes Cesar medications every night as he is supposed to, but he had recently been staying with Cesar Robinson for the last couple days and not been on Cesar medications.   Patient reported that him and Cesar twin brother do not get along because Cesar Robinson killed Cesar mother.  Endorses daily tobacco use, denies recent falls, no alcohol use or illicit drug use.  Did not report any chest pain, no abdominal pain, no dysuria, no paresthesias or lower extremity edema.  In the ED, temperature 98.7 F, HR 115, RR 18, BP 141/94, SpO2 100% on room air.  WBC 14.7, hemoglobin 13.3, platelet count 220.  Sodium 141, potassium 3.9, chloride 108, CO2 15, glucose 86, BUN 24, creat 1.04.  AST 79, ALT 27, total bilirubin 1.2.  CK 3211.  Procalcitonin 0.10.  Lactic acid 1.2.  Urinalysis unrevealing.  EtOH level less than 10.  Salicylate level less than 7.0.  UDS positive for THC.  Chest x-ray with no active cardiopulmonary disease process. Patient had  been given 1 L of normal saline IV fluids, Ativan  2 mg IM, Haldol  5 mg IM, and subsequently Geodon  20 mg IM.  TRH consulted for admission.  Assessment & Plan:   Rhabdomyolysis, mild CK level elevated 3211.  Etiology likely secondary to dehydration in the setting of elevated BUN to creatinine ratio; complicated by Cesar acute psychosis.  Patient was given 1 L IV fluid bolus.  Denies myalgias -- CK [phone] -- No further IV fluids needed, encourage increased oral intake  Leukocytosis, likely reactive: Resolved Patient was noted to be tachycardic, elevated WBC count of 115.  Etiology likely secondary to dehydration/acute psychosis with reactive leukocytosis.  Procalcitonin reassuring.  Urinalysis with 5 ketones, otherwise negative.  Chest x-ray with no active cardiopulmonary disease process.  Patient was treated with 1 L IV fluid bolus with resolution of elevated WBC count -- WBC 14.7>8.1  Acute psychosis under IVC Hx schizophrenia Medical noncompliance Patient was noted to not recognize Cesar Robinson and reporting that Cesar Robinson was trying to "kill" Cesar mother.  Patient was placed under IVC and transported the ED by [employer].  In the ED patient received Ativan  2 mg IM, Haldol  5 mg IM and subsequently Geodon  20 mg IM. -- Psychiatry following -- Valproic acid  level less than 10 -- Depakote  1 g p.o. nightly -- Pradaxa 20 mg p.o. nightly -- Continue IVC, sitter -- Medically stable to discharge to inpatient psych once bed available  Anion gap metabolic acidosis CO2 15  with initial anion gap 18 on admission.  Salicylate, acetaminophen  and alcohol level undetectable.  Urinalysis with 5 ketones.  Etiology likely secondary to dehydration with elevated BUN of 24 on admission versus starvation ketosis. Supported with IV fluid hydration with resolution.   Elevated LFTs: Improving Acute hepatitis panel negative.  Salicylate/acetaminophen  level negative.  EtOH level less than 10.  Etiology likely  secondary to dehydration/mild rhabdomyolysis. -- AST 79>69>43 -- ALT 27>23>19 -- Tbili 1.2>>0.7  Marijuana and tobacco use Patient reports smoking cigarettes.  UDS positive for marijuana.  Unclear at this time if this or some unknown substances also factoring into patient's acute psychosis. -- Counseled on need of cessation of marijuana and tobacco use    DVT prophylaxis: enoxaparin  (LOVENOX ) injection 40 mg Start: 08/02/23 0800    Code Status: Full Code Family Communication: No family present at bedside this morning  Disposition Plan:  Level of care: Telemetry Medical Status is: Inpatient Remains inpatient appropriate because: Transfer to inpatient psychiatry pending    Consultants:  Psychiatry  Procedures:  None  Antimicrobials:  None   Subjective: Patient seen examined bedside, lying in bed.  Awake watching TV.  Sitter present at bedside.  No concerns overnight.  States "I take my medicine".  When asked about events leading to hospitalization states "that is what happened".  Reports that he takes Depakote  and Zyprexa  and states "do not miss doses".   Denies any current auditory or visual hallucinations, denies SI/HI.   No other specific complaints, questions or concerns at this time.  Denies headache, no chest pain, no shortness of breath, no abdominal pain, no myalgias.  No acute events overnight per nursing staff.  Patient remains medically stable for transfer to inpatient psych once bed available.  Objective: Vitals:   08/03/23 1324 08/03/23 2156 08/04/23 0509 08/04/23 0859  BP: 108/70 124/88 111/78 119/79  Pulse: 93 86 66 85  Resp: 16 18 16 16   Temp: 98.7 F (37.1 C) 98.2 F (36.8 C)  98.3 F (36.8 C)  TempSrc: Oral   Oral  SpO2: 98% 100% 99% 98%  Weight:      Height:        Intake/Output Summary (Last 24 hours) at 08/04/2023 1136 Last data filed at 08/04/2023 0900 Gross per 24 hour  Intake 1680 ml  Output --  Net 1680 ml   Filed Weights   08/02/23  1106  Weight: 58.1 kg    Examination:  Physical Exam: GEN: NAD, alert and oriented x 3, wd/wn HEENT: NCAT, PERRL, EOMI, sclera clear, MMM PULM: CTAB w/o wheezes/crackles, normal respiratory effort, on room air CV: RRR w/o M/G/R GI: abd soft, NTND, NABS, no R/G/M MSK: no peripheral edema, moves all extremities independently NEURO: No focal neurological deficits PSYCH: Denies SI/HI, no auditory/visual hallucinations Integumentary: No concerning rashes/lesions/wounds noted on exposed skin surfaces    Data Reviewed: I have personally reviewed following labs and imaging studies  CBC: Recent Labs  Lab 08/01/23 2240 08/03/23 0534  WBC 14.7* 8.1  NEUTROABS 11.6*  --   HGB 13.3 10.0*  HCT 40.2 29.0*  MCV 97.3 94.2  PLT 220 158   Basic Metabolic Panel: Recent Labs  Lab 08/01/23 2240 08/02/23 0258 08/03/23 0534  NA 141 137 141  K 3.9 4.3 3.8  CL 108 106 111  CO2 15* 15* 24  GLUCOSE 86 70 83  BUN 24* 22* 5*  CREATININE 1.04 0.99 0.74  CALCIUM  9.8 8.3* 8.4*   GFR: Estimated Creatinine Clearance: 97.8 mL/min (by C-G  formula based on SCr of 0.74 mg/dL). Liver Function Tests: Recent Labs  Lab 08/01/23 2240 08/02/23 0258 08/03/23 0534  AST 79* 69* 43*  ALT 27 23 19   ALKPHOS 61 52 38  BILITOT 1.2 1.1 0.7  PROT 8.1 6.7 4.9*  ALBUMIN 4.8 3.8 2.6*   No results for input(s): "LIPASE", "AMYLASE" in the last 168 hours. No results for input(s): "AMMONIA" in the last 168 hours. Coagulation Profile: No results for input(s): "INR", "PROTIME" in the last 168 hours. Cardiac Enzymes: Recent Labs  Lab 08/02/23 0258 08/03/23 0534  CKTOTAL 3,211* 1,692*   BNP (last 3 results) No results for input(s): "PROBNP" in the last 8760 hours. HbA1C: No results for input(s): "HGBA1C" in the last 72 hours. CBG: No results for input(s): "GLUCAP" in the last 168 hours. Lipid Profile: No results for input(s): "CHOL", "HDL", "LDLCALC", "TRIG", "CHOLHDL", "LDLDIRECT" in the last 72  hours. Thyroid  Function Tests: No results for input(s): "TSH", "T4TOTAL", "FREET4", "T3FREE", "THYROIDAB" in the last 72 hours. Anemia Panel: No results for input(s): "VITAMINB12", "FOLATE", "FERRITIN", "TIBC", "IRON", "RETICCTPCT" in the last 72 hours. Sepsis Labs: Recent Labs  Lab 08/02/23 0003 08/02/23 1610  PROCALCITON  --  0.10  LATICACIDVEN 1.2  --     No results found for this or any previous visit (from the past 240 hours).       Radiology Studies: No results found.       Scheduled Meds:  divalproex   1,000 mg Oral QHS   enoxaparin  (LOVENOX ) injection  40 mg Subcutaneous Q24H   nicotine   14 mg Transdermal Daily   OLANZapine   20 mg Oral QHS   sodium chloride  flush  3 mL Intravenous Q12H   Continuous Infusions:   LOS: 2 days    Time spent: 52 minutes spent on chart review, discussion with nursing staff, consultants, updating family and interview/physical exam; more than 50% of that time was spent in counseling and/or coordination of care.    Rema Care [place], DO [facility] Available via Epic secure chat 7am-7pm After these hours, please refer to coverage provider listed on [email or web address] 08/04/2023, 11:36 AM

## 2023-08-05 ENCOUNTER — Encounter (HOSPITAL_COMMUNITY): Payer: Self-pay

## 2023-08-05 ENCOUNTER — Inpatient Hospital Stay (HOSPITAL_COMMUNITY)
Admission: AD | Admit: 2023-08-05 | Discharge: 2023-08-17 | DRG: 885 | Disposition: A | Discharge status: Home / Self Care | Payer: 59 | Source: Intra-hospital | Attending: Psychiatry | Admitting: Psychiatry

## 2023-08-05 DIAGNOSIS — F200 Paranoid schizophrenia: Principal | ICD-10-CM | POA: Diagnosis present

## 2023-08-05 DIAGNOSIS — T426X6A Underdosing of other antiepileptic and sedative-hypnotic drugs, initial encounter: Secondary | ICD-10-CM | POA: Diagnosis present

## 2023-08-05 DIAGNOSIS — F17213 Nicotine dependence, cigarettes, with withdrawal: Secondary | ICD-10-CM | POA: Diagnosis present

## 2023-08-05 DIAGNOSIS — Z79899 Other long term (current) drug therapy: Secondary | ICD-10-CM

## 2023-08-05 DIAGNOSIS — F209 Schizophrenia, unspecified: Principal | ICD-10-CM | POA: Diagnosis present

## 2023-08-05 DIAGNOSIS — T43596A Underdosing of other antipsychotics and neuroleptics, initial encounter: Secondary | ICD-10-CM | POA: Diagnosis present

## 2023-08-05 DIAGNOSIS — Z5941 Food insecurity: Secondary | ICD-10-CM

## 2023-08-05 DIAGNOSIS — M6282 Rhabdomyolysis: Secondary | ICD-10-CM | POA: Diagnosis not present

## 2023-08-05 DIAGNOSIS — Z560 Unemployment, unspecified: Secondary | ICD-10-CM | POA: Diagnosis not present

## 2023-08-05 DIAGNOSIS — G4730 Sleep apnea, unspecified: Secondary | ICD-10-CM | POA: Diagnosis present

## 2023-08-05 DIAGNOSIS — F1210 Cannabis abuse, uncomplicated: Secondary | ICD-10-CM | POA: Diagnosis present

## 2023-08-05 DIAGNOSIS — F203 Undifferentiated schizophrenia: Secondary | ICD-10-CM | POA: Diagnosis not present

## 2023-08-05 LAB — VALPROIC ACID LEVEL: Valproic Acid Lvl: 65 ug/mL (ref 50.0–100.0)

## 2023-08-05 MED ORDER — ALUM & MAG HYDROXIDE-SIMETH 200-200-20 MG/5ML PO SUSP: 30.0000 mL | ORAL | Status: DC | PRN

## 2023-08-05 MED ORDER — ACETAMINOPHEN 325 MG PO TABS: 650.0000 mg | ORAL_TABLET | Freq: Four times a day (QID) | ORAL | Status: DC | PRN

## 2023-08-05 MED ORDER — NICOTINE 14 MG/24HR TD PT24
14.0000 mg | MEDICATED_PATCH | Freq: Every day | TRANSDERMAL | Status: DC
  Administered 2023-08-06 – 2023-08-16 (×7): 14 mg via TRANSDERMAL
  Filled 2023-08-05 (×14): qty 1

## 2023-08-05 MED ORDER — HALOPERIDOL 5 MG PO TABS
5.0000 mg | ORAL_TABLET | Freq: Three times a day (TID) | ORAL | Status: DC | PRN
  Administered 2023-08-15 (×2): 5 mg via ORAL
  Filled 2023-08-05 (×2): qty 1

## 2023-08-05 MED ORDER — DIPHENHYDRAMINE HCL 25 MG PO CAPS
50.0000 mg | ORAL_CAPSULE | Freq: Three times a day (TID) | ORAL | Status: DC | PRN
  Administered 2023-08-15 (×2): 50 mg via ORAL
  Filled 2023-08-05 (×2): qty 2

## 2023-08-05 MED ORDER — ALBUTEROL SULFATE (2.5 MG/3ML) 0.083% IN NEBU: 2.5000 mg | INHALATION_SOLUTION | Freq: Four times a day (QID) | RESPIRATORY_TRACT | Status: DC | PRN

## 2023-08-05 MED ORDER — OLANZAPINE 10 MG PO TABS
20.0000 mg | ORAL_TABLET | Freq: Every day | ORAL | Status: DC
Start: 2023-08-05 — End: 2023-08-11
  Administered 2023-08-05 – 2023-08-10 (×6): 20 mg via ORAL
  Filled 2023-08-05 (×10): qty 2

## 2023-08-05 MED ORDER — MAGNESIUM HYDROXIDE 400 MG/5ML PO SUSP: 30.0000 mL | Freq: Every day | ORAL | Status: DC | PRN

## 2023-08-05 MED ORDER — DIVALPROEX SODIUM ER 500 MG PO TB24
1000.0000 mg | ORAL_TABLET | Freq: Every day | ORAL | Status: DC
  Administered 2023-08-05 – 2023-08-16 (×12): 1000 mg via ORAL
  Filled 2023-08-05 (×18): qty 2

## 2023-08-05 NOTE — Progress Notes (Signed)
Report given to inpatient RN Lanora Manis

## 2023-08-05 NOTE — [facility] Group Notes (Signed)
[facility] Group Notes:  (Nursing/MHT/Case Management/Adjunct)  Date:  08/05/2023  Time:  9:15 PM  Type of Therapy:   Wrap-up group  Participation Level:  Did Not Attend  Participation Quality:    Affect:    Cognitive:    Insight:    Engagement in Group:    Modes of Intervention:    Summary of Progress/Problems: Refused to attend group.  Cesar Robinson 08/05/2023, 9:15 PM

## 2023-08-05 NOTE — Progress Notes (Signed)
   08/05/23 2000  Psych Admission Type (Psych Patients Only)  Admission Status Involuntary  Psychosocial Assessment  Patient Complaints Suspiciousness  Eye Contact Fair  Facial Expression Flat  Affect Anxious;Preoccupied  Speech Logical/coherent  Interaction Avoidant  Motor Activity Slow  Appearance/Hygiene Disheveled  Behavior Characteristics Cooperative  Mood Preoccupied;Suspicious  Aggressive Behavior  Effect No apparent injury  Thought Process  Coherency Disorganized  Content Preoccupation  Delusions Religious  Perception WDL  Hallucination None reported or observed  Judgment Impaired  Confusion WDL  Danger to Self  Current suicidal ideation? Denies  Danger to Others  Danger to Others None reported or observed

## 2023-08-05 NOTE — TOC Transition Note (Signed)
Transition of Care [facility]) - Discharge Note   Patient Details  Name: Cesar Robinson MRN: [id number] Date of Birth: [birth year]  Transition of Care [facility] CM/SW Contact:  Rimas Gilham A [place], Theresia Majors Phone Number: 08/05/2023, 3:29 PM   Clinical Narrative:     Pt medically stable for DC.  Discharged to [facility]. Pt declined collateral contact.  Nursing called report to: [phone]  DC transport arranged with [employer] due to pt's IVC.   No other needs identified at this time. TOC will sign off, please consult again if TOC needs arise.    Final next level of care: Skilled Nursing Facility Barriers to Discharge: Barriers Resolved   Patient Goals and CMS Choice            Discharge Placement                Patient to be transferred to facility by: [employer] Name of family member notified: Pt declined Patient and family notified of of transfer: 08/05/23  Discharge Plan and Services Additional resources added to the After Visit Summary for                                       Social Drivers of Health (SDOH) Interventions SDOH Screenings   Food Insecurity: Food Insecurity Present (08/05/2023)  Housing: Unknown (08/05/2023)  Transportation Needs: Patient Unable To Answer (08/05/2023)  Utilities: Patient Unable To Answer (08/05/2023)  Tobacco Use: High Risk (08/02/2023)     Readmission Risk Interventions     No data to display

## 2023-08-05 NOTE — Group Note (Signed)
Date:  08/05/2023 Time:  2:03 PM  Group Topic/Focus:  Goals Group:   The focus of this group is to help patients establish daily goals to achieve during treatment and discuss how the patient can incorporate goal setting into their daily lives to aide in recovery. Orientation:   The focus of this group is to educate the patient on the purpose and policies of crisis stabilization and provide a format to answer questions about their admission.  The group details unit policies and expectations of patients while admitted.    Participation Level:  Did Not Attend  Participation Quality:   n/a  Affect:   n/a  Cognitive:   n/a  Insight: None  Engagement in Group:   n/a  Modes of Intervention:   n/a  Additional Comments:   Pt did not attend.  Edmund Hilda Mostafa Yuan 08/05/2023, 2:03 PM

## 2023-08-05 NOTE — Tx Team (Signed)
Initial Treatment Plan 08/05/2023 3:54 PM Cesar Robinson [id number]    PATIENT STRESSORS: Medication change or noncompliance     PATIENT STRENGTHS: Average or above average intelligence  Physical Health  Supportive family/friends    PATIENT IDENTIFIED PROBLEMS: Paranoia   Med non compliance    "Sleeping too much"               DISCHARGE CRITERIA:  Improved stabilization in mood, thinking, and/or behavior Need for constant or close observation no longer present Reduction of life-threatening or endangering symptoms to within safe limits Verbal commitment to aftercare and medication compliance  PRELIMINARY DISCHARGE PLAN: Outpatient therapy Return to previous living arrangement  PATIENT/FAMILY INVOLVEMENT: This treatment plan has been presented to and reviewed with the patient, Cesar Robinson,   The patient has been given the opportunity to ask questions and make suggestions.  Shela Nevin, RN 08/05/2023, 3:54 PM

## 2023-08-05 NOTE — Plan of Care (Signed)
  Problem: Education: Goal: Knowledge of General Education information will improve Description: Including pain rating scale, medication(s)/side effects and non-pharmacologic comfort measures 08/05/2023 1321 by Zane Herald, Doylene Canning, RN Outcome: Adequate for Discharge 08/05/2023 1051 by Zane Herald, Doylene Canning, RN Outcome: Progressing   Problem: Health Behavior/Discharge Planning: Goal: Ability to manage health-related needs will improve 08/05/2023 1321 by Zane Herald, Doylene Canning, RN Outcome: Adequate for Discharge 08/05/2023 1051 by Zane Herald, Doylene Canning, RN Outcome: Progressing   Problem: Clinical Measurements: Goal: Ability to maintain clinical measurements within normal limits will improve 08/05/2023 1321 by Zane Herald, Doylene Canning, RN Outcome: Adequate for Discharge 08/05/2023 1051 by Zane Herald, Doylene Canning, RN Outcome: Progressing Goal: Will remain free from infection 08/05/2023 1321 by Zane Herald, Doylene Canning, RN Outcome: Adequate for Discharge 08/05/2023 1051 by Zane Herald, Doylene Canning, RN Outcome: Progressing Goal: Diagnostic test results will improve 08/05/2023 1321 by Zane Herald, Doylene Canning, RN Outcome: Adequate for Discharge 08/05/2023 1051 by Zane Herald, Doylene Canning, RN Outcome: Progressing Goal: Respiratory complications will improve 08/05/2023 1321 by Zane Herald, Doylene Canning, RN Outcome: Adequate for Discharge 08/05/2023 1051 by Zane Herald, Doylene Canning, RN Outcome: Progressing Goal: Cardiovascular complication will be avoided 08/05/2023 1321 by Zane Herald, Doylene Canning, RN Outcome: Adequate for Discharge 08/05/2023 1051 by Zane Herald, Doylene Canning, RN Outcome: Progressing   Problem: Activity: Goal: Risk for activity intolerance will decrease 08/05/2023 1321 by Zane Herald, Doylene Canning, RN Outcome: Adequate for Discharge 08/05/2023 1051 by Zane Herald, Doylene Canning,  RN Outcome: Progressing   Problem: Nutrition: Goal: Adequate nutrition will be maintained 08/05/2023 1321 by Zane Herald, Doylene Canning, RN Outcome: Adequate for Discharge 08/05/2023 1051 by Zane Herald, Doylene Canning, RN Outcome: Progressing   Problem: Coping: Goal: Level of anxiety will decrease 08/05/2023 1321 by Zane Herald, Doylene Canning, RN Outcome: Adequate for Discharge 08/05/2023 1051 by Zane Herald, Doylene Canning, RN Outcome: Progressing   Problem: Elimination: Goal: Will not experience complications related to bowel motility 08/05/2023 1321 by Zane Herald, Doylene Canning, RN Outcome: Adequate for Discharge 08/05/2023 1051 by Zane Herald, Doylene Canning, RN Outcome: Progressing Goal: Will not experience complications related to urinary retention 08/05/2023 1321 by Zane Herald, Doylene Canning, RN Outcome: Adequate for Discharge 08/05/2023 1051 by Zane Herald, Doylene Canning, RN Outcome: Progressing   Problem: Pain Management: Goal: General experience of comfort will improve 08/05/2023 1321 by Zane Herald, Doylene Canning, RN Outcome: Adequate for Discharge 08/05/2023 1051 by Zane Herald, Doylene Canning, RN Outcome: Progressing   Problem: Safety: Goal: Ability to remain free from injury will improve 08/05/2023 1321 by Zane Herald, Doylene Canning, RN Outcome: Adequate for Discharge 08/05/2023 1051 by Zane Herald, Doylene Canning, RN Outcome: Progressing   Problem: Skin Integrity: Goal: Risk for impaired skin integrity will decrease 08/05/2023 1321 by Zane Herald, Doylene Canning, RN Outcome: Adequate for Discharge 08/05/2023 1051 by Zane Herald, Doylene Canning, RN Outcome: Progressing

## 2023-08-05 NOTE — Plan of Care (Signed)
  Problem: Activity: Goal: Sleeping patterns will improve Outcome: Progressing   Problem: Safety: Goal: Periods of time without injury will increase Outcome: Progressing   Problem: Activity: Goal: Interest or engagement in activities will improve Outcome: Not Progressing   Problem: Activity: Goal: Interest or engagement in activities will improve Outcome: Not Progressing

## 2023-08-05 NOTE — Progress Notes (Addendum)
Patient is a 43 year old male with hx of schizophrenia who presented to [facility] from [facility] under IVC due to paranoid behavior.Per report, pt had been inconsistent with his medications due to staying with his twin brother. Pt currently denies SI/HI, A/VH and paranoia. Pt did present cautious, guarded and appeared to be thought blocking during admission interview and assessment. Pt was vague about his hx, and forwarded very little information.Pt's only complaint was that he "slept too much". Pt denied alcohol and illicit drug use, and endorsed smoking 'Black and Milds" - approximately one a day. Admission paperwork completed and signed. VS monitored and recorded. Skin check performed with MHT. Patient had no belongings with him to lock up. Patient was oriented to unit and schedule. Q 15 min checks initiated for safety. Patient provided with po fluids.

## 2023-08-05 NOTE — Plan of Care (Signed)
  Problem: Education: Goal: Emotional status will improve Outcome: Progressing Goal: Mental status will improve Outcome: Progressing   Problem: Activity: Goal: Interest or engagement in activities will improve 08/05/2023 2133 by Delos Haring, RN Outcome: Progressing 08/05/2023 2126 by Delos Haring, RN Outcome: Not Progressing Goal: Sleeping patterns will improve 08/05/2023 2133 by Delos Haring, RN Outcome: Progressing 08/05/2023 2126 by Delos Haring, RN Outcome: Progressing   Problem: Safety: Goal: Periods of time without injury will increase Outcome: Progressing   Problem: Activity: Goal: Interest or engagement in activities will improve Outcome: Not Progressing

## 2023-08-05 NOTE — Progress Notes (Signed)
PROGRESS NOTE    Cesar Robinson  UEA:[id number] DOB: 05/21/1981 DOA: 08/01/2023 PCP: Oneita Hurt, No    Brief Narrative:   Cesar Robinson is a 43 y.o. male with past medical history significant for schizophrenia with medical noncompliance who presents to [facility] ED on 1/12 via law enforcement due to paranoid behaviors.  Patient apparently was found standing outside on the lawn of unknown home.  Please try to transport patient back to his residence and he did not recognize his twin brother and reported that "his brother tried to kill his mother".  Patient had been exceedingly paranoid with police and difficult to redirect.  And given his behavior, [employer] transported patient to the hospital for further evaluation and management.  The patient's narrative was somewhat disjointed and lacked detail, making it difficult to ascertain the full extent of their current health status. However, they did not report any acute distress or new symptoms.  His mother notes that normally when he is with her he takes his medications every night as he is supposed to, but he had recently been staying with his twin brother for the last couple days and not been on his medications.   Patient reported that him and his twin brother do not get along because his brother killed his mother.  Endorses daily tobacco use, denies recent falls, no alcohol use or illicit drug use.  Did not report any chest pain, no abdominal pain, no dysuria, no paresthesias or lower extremity edema.  In the ED, temperature 98.7 F, HR 115, RR 18, BP 141/94, SpO2 100% on room air.  WBC 14.7, hemoglobin 13.3, platelet count 220.  Sodium 141, potassium 3.9, chloride 108, CO2 15, glucose 86, BUN 24, creat 1.04.  AST 79, ALT 27, total bilirubin 1.2.  CK 3211.  Procalcitonin 0.10.  Lactic acid 1.2.  Urinalysis unrevealing.  EtOH level less than 10.  Salicylate level less than 7.0.  UDS positive for THC.  Chest x-ray with no active cardiopulmonary disease process. Patient had  been given 1 L of normal saline IV fluids, Ativan 2 mg IM, Haldol 5 mg IM, and subsequently Geodon 20 mg IM.  TRH consulted for admission.  Assessment & Plan:   Rhabdomyolysis, mild CK level elevated 3211.  Etiology likely secondary to dehydration in the setting of elevated BUN to creatinine ratio; complicated by his acute psychosis.  Patient was given 1 L IV fluid bolus.  Denies myalgias -- CK [phone] -- No further IV fluids needed, encourage increased oral intake  Leukocytosis, likely reactive: Resolved Patient was noted to be tachycardic, elevated WBC count of 115.  Etiology likely secondary to dehydration/acute psychosis with reactive leukocytosis.  Procalcitonin reassuring.  Urinalysis with 5 ketones, otherwise negative.  Chest x-ray with no active cardiopulmonary disease process.  Patient was treated with 1 L IV fluid bolus with resolution of elevated WBC count -- WBC 14.7>8.1  Acute psychosis under IVC Hx schizophrenia Medical noncompliance Patient was noted to not recognize his twin brother and reporting that his brother was trying to "kill" his mother.  Patient was placed under IVC and transported the ED by [employer].  In the ED patient received Ativan 2 mg IM, Haldol 5 mg IM and subsequently Geodon 20 mg IM. -- Psychiatry following -- Valproic acid level less than 10 on admission; up to 65 on 1/16 (wnL) -- Depakote 1 g p.o. nightly -- Pradaxa 20 mg p.o. nightly -- Continue IVC, sitter -- Medically stable to discharge to inpatient psych once bed  available  Anion gap metabolic acidosis CO2 15 with initial anion gap 18 on admission.  Salicylate, acetaminophen and alcohol level undetectable.  Urinalysis with 5 ketones.  Etiology likely secondary to dehydration with elevated BUN of 24 on admission versus starvation ketosis. Supported with IV fluid hydration with resolution.   Elevated LFTs: Improving Acute hepatitis panel negative.  Salicylate/acetaminophen level negative.  EtOH level  less than 10.  Etiology likely secondary to dehydration/mild rhabdomyolysis. -- AST 79>69>43 -- ALT 27>23>19 -- Tbili 1.2>>0.7  Marijuana and tobacco use Patient reports smoking cigarettes.  UDS positive for marijuana.  Unclear at this time if this or some unknown substances also factoring into patient's acute psychosis. -- Counseled on need of cessation of marijuana and tobacco use    DVT prophylaxis: enoxaparin (LOVENOX) injection 40 mg Start: 08/02/23 0800    Code Status: Full Code Family Communication: No family present at bedside this morning  Disposition Plan:  Level of care: Telemetry Medical Status is: Inpatient Remains inpatient appropriate because: Transfer to inpatient psychiatry pending    Consultants:  Psychiatry  Procedures:  None  Antimicrobials:  None   Subjective: Patient seen examined bedside, lying in bed.  Sitter present at bedside.  No concerns overnight.  Denies any current auditory or visual hallucinations, denies SI/HI.   No other specific complaints, questions or concerns at this time.  Denies headache, no chest pain, no shortness of breath, no abdominal pain, no myalgias.  No acute events overnight per nursing staff.  Patient remains medically stable for transfer to inpatient psych once bed available.  Objective: Vitals:   08/04/23 2055 08/05/23 0039 08/05/23 0608 08/05/23 0739  BP: 119/85 102/70 121/75 121/79  Pulse: 71 66 83 66  Resp: 17 16 18 17   Temp: 97.8 F (36.6 C) 98.7 F (37.1 C) 97.9 F (36.6 C) 97.8 F (36.6 C)  TempSrc: Oral Oral Oral Oral  SpO2: 100% 98% 100% 97%  Weight:      Height:        Intake/Output Summary (Last 24 hours) at 08/05/2023 1051 Last data filed at 08/04/2023 1750 Gross per 24 hour  Intake 720 ml  Output --  Net 720 ml   Filed Weights   08/02/23 1106  Weight: 58.1 kg    Examination:  Physical Exam: GEN: NAD, alert and oriented x 3, wd/wn HEENT: NCAT, PERRL, EOMI, sclera clear, MMM PULM: CTAB  w/o wheezes/crackles, normal respiratory effort, on room air CV: RRR w/o M/G/R GI: abd soft, NTND, NABS, no R/G/M MSK: no peripheral edema, moves all extremities independently NEURO: No focal neurological deficits PSYCH: Denies SI/HI, no auditory/visual hallucinations Integumentary: No concerning rashes/lesions/wounds noted on exposed skin surfaces    Data Reviewed: I have personally reviewed following labs and imaging studies  CBC: Recent Labs  Lab 08/01/23 2240 08/03/23 0534  WBC 14.7* 8.1  NEUTROABS 11.6*  --   HGB 13.3 10.0*  HCT 40.2 29.0*  MCV 97.3 94.2  PLT 220 158   Basic Metabolic Panel: Recent Labs  Lab 08/01/23 2240 08/02/23 0258 08/03/23 0534  NA 141 137 141  K 3.9 4.3 3.8  CL 108 106 111  CO2 15* 15* 24  GLUCOSE 86 70 83  BUN 24* 22* 5*  CREATININE 1.04 0.99 0.74  CALCIUM 9.8 8.3* 8.4*   GFR: Estimated Creatinine Clearance: 97.8 mL/min (by C-G formula based on SCr of 0.74 mg/dL). Liver Function Tests: Recent Labs  Lab 08/01/23 2240 08/02/23 0258 08/03/23 0534  AST 79* 69* 43*  ALT  27 23 19   ALKPHOS 61 52 38  BILITOT 1.2 1.1 0.7  PROT 8.1 6.7 4.9*  ALBUMIN 4.8 3.8 2.6*   No results for input(s): "LIPASE", "AMYLASE" in the last 168 hours. No results for input(s): "AMMONIA" in the last 168 hours. Coagulation Profile: No results for input(s): "INR", "PROTIME" in the last 168 hours. Cardiac Enzymes: Recent Labs  Lab 08/02/23 0258 08/03/23 0534  CKTOTAL 3,211* 1,692*   BNP (last 3 results) No results for input(s): "PROBNP" in the last 8760 hours. HbA1C: No results for input(s): "HGBA1C" in the last 72 hours. CBG: No results for input(s): "GLUCAP" in the last 168 hours. Lipid Profile: No results for input(s): "CHOL", "HDL", "LDLCALC", "TRIG", "CHOLHDL", "LDLDIRECT" in the last 72 hours. Thyroid Function Tests: No results for input(s): "TSH", "T4TOTAL", "FREET4", "T3FREE", "THYROIDAB" in the last 72 hours. Anemia Panel: No results for  input(s): "VITAMINB12", "FOLATE", "FERRITIN", "TIBC", "IRON", "RETICCTPCT" in the last 72 hours. Sepsis Labs: Recent Labs  Lab 08/02/23 0003 08/02/23 1610  PROCALCITON  --  0.10  LATICACIDVEN 1.2  --     No results found for this or any previous visit (from the past 240 hours).       Radiology Studies: No results found.       Scheduled Meds:  divalproex  1,000 mg Oral QHS   enoxaparin (LOVENOX) injection  40 mg Subcutaneous Q24H   nicotine  14 mg Transdermal Daily   OLANZapine  20 mg Oral QHS   sodium chloride flush  3 mL Intravenous Q12H   Continuous Infusions:   LOS: 3 days    Time spent: 48 minutes spent on chart review, discussion with nursing staff, consultants, updating family and interview/physical exam; more than 50% of that time was spent in counseling and/or coordination of care.    Alvira Philips [place], DO [facility] Available via Epic secure chat 7am-7pm After these hours, please refer to coverage provider listed on [email or web address] 08/05/2023, 10:51 AM

## 2023-08-05 NOTE — Plan of Care (Signed)

## 2023-08-05 NOTE — Group Note (Signed)
Date:  08/05/2023 Time:  5:14 PM  Group Topic/Focus:  Nutrition Group    Participation Level:  Did Not Attend  Participation Quality:   n/a  Affect:   n/a  Cognitive:   n/a  Insight: None  Engagement in Group:   n/a  Modes of Intervention:   n/a  Additional Comments:   Pt did not attend.  Cesar Robinson 08/05/2023, 5:14 PM

## 2023-08-05 NOTE — Plan of Care (Signed)
  Problem: Education: Goal: Emotional status will improve Outcome: Progressing Goal: Mental status will improve Outcome: Progressing   

## 2023-08-05 NOTE — Group Note (Signed)
Date:  08/05/2023 Time:  4:53 PM  Group Topic/Focus:  Dimensions of Wellness:   The focus of this group is to introduce the topic of wellness and discuss the role each dimension of wellness plays in total health. Emotional Education:   The focus of this group is to discuss what feelings/emotions are, and how they are experienced.    Participation Level:  Did Not Attend  Participation Quality:   n/a  Affect:   n/a  Cognitive:   n/a  Insight: None  Engagement in Group:   n/a  Modes of Intervention:   n/a  Additional Comments:   Pt did not attend.  Cesar Robinson 08/05/2023, 4:53 PM

## 2023-08-05 NOTE — Discharge Summary (Signed)
Physician Discharge Summary  Cesar Robinson ZOX:[id number] DOB: 03-Dec-1980 DOA: 08/01/2023  PCP: Pcp, No  Admit date: 08/01/2023 Discharge date: 08/05/2023  Admitted From: Home Disposition: [facility] under involuntary commitment   Discharge Condition: Stable CODE STATUS: Full code Diet recommendation: Regular diet  History of present illness:  Cesar Robinson is a 43 y.o. male with past medical history significant for schizophrenia with medical noncompliance who presents to [facility] ED on 1/12 via law enforcement due to paranoid behaviors.  Patient apparently was found standing outside on the lawn of unknown home.  Please try to transport patient back to his residence and he did not recognize his twin brother and reported that "his brother tried to kill his mother".  Patient had been exceedingly paranoid with police and difficult to redirect.  And given his behavior, [employer] transported patient to the hospital for further evaluation and management.   The patient's narrative was somewhat disjointed and lacked detail, making it difficult to ascertain the full extent of their current health status. However, they did not report any acute distress or new symptoms.  His mother notes that normally when he is with her he takes his medications every night as he is supposed to, but he had recently been staying with his twin brother for the last couple days and not been on his medications.    Patient reported that him and his twin brother do not get along because his brother killed his mother.  Endorses daily tobacco use, denies recent falls, no alcohol use or illicit drug use.  Did not report any chest pain, no abdominal pain, no dysuria, no paresthesias or lower extremity edema.   In the ED, temperature 98.7 F, HR 115, RR 18, BP 141/94, SpO2 100% on room air.  WBC 14.7, hemoglobin 13.3, platelet count 220.  Sodium 141, potassium 3.9, chloride 108, CO2 15, glucose 86, BUN 24, creat 1.04.  AST 79,  ALT 27, total bilirubin 1.2.  CK 3211.  Procalcitonin 0.10.  Lactic acid 1.2.  Urinalysis unrevealing.  EtOH level less than 10.  Salicylate level less than 7.0.  UDS positive for THC.  Chest x-ray with no active cardiopulmonary disease process. Patient had been given 1 L of normal saline IV fluids, Ativan 2 mg IM, Haldol 5 mg IM, and subsequently Geodon 20 mg IM.  TRH consulted for admission.  Hospital course:  Rhabdomyolysis, mild CK level elevated 3211.  Etiology likely secondary to dehydration in the setting of elevated BUN to creatinine ratio; complicated by his acute psychosis.  Patient was given 1 L IV fluid bolus.  Denies myalgias, CK level improved to 1692.  Continue encourage increased oral intake.   Leukocytosis, likely reactive: Resolved Patient was noted to be tachycardic, elevated WBC count of 115.  Etiology likely secondary to dehydration/acute psychosis with reactive leukocytosis.  Procalcitonin reassuring.  Urinalysis with 5 ketones, otherwise negative.  Chest x-ray with no active cardiopulmonary disease process.  Patient was treated with 1 L IV fluid bolus with resolution of elevated WBC count   Acute psychosis under IVC Hx schizophrenia Medical noncompliance Patient was noted to not recognize his twin brother and reporting that his brother was trying to "kill" his mother.  Patient was placed under IVC and transported the ED by [employer].  In the ED patient received Ativan 2 mg IM, Haldol 5 mg IM and subsequently Geodon 20 mg IM.  Psychiatry is consulted and followed during hospital course.  Valproic acid level less than 10 on  admission which is consistent with nonadherence.  Patient was restarted on Depakote 1 g p.o. nightly and Zyprexa 20 g p.o. nightly.  Patient remained on IVC and discharging to inpatient psychiatry.   Anion gap metabolic acidosis CO2 15 with initial anion gap 18 on admission.  Salicylate, acetaminophen and alcohol level undetectable.  Urinalysis with 5 ketones.   Etiology likely secondary to dehydration with elevated BUN of 24 on admission versus starvation ketosis. Supported with IV fluid hydration with resolution.    Elevated LFTs: Improving Acute hepatitis panel negative.  Salicylate/acetaminophen level negative.  EtOH level less than 10.  Etiology likely secondary to dehydration/mild rhabdomyolysis.   Marijuana and tobacco use Patient reports smoking cigarettes.  UDS positive for marijuana.  Unclear at this time if this or some unknown substances also factoring into patient's acute psychosis.  Counseled on need of cessation of marijuana and tobacco use   Discharge Diagnoses:  Principal Problem:   Rhabdomyolysis Active Problems:   SIRS (systemic inflammatory response syndrome) (HCC)   Psychotic episode (HCC)   Schizophrenia (HCC)   Involuntary commitment   Metabolic acidosis with increased anion gap and accumulation of organic acids   Elevated AST (SGOT)   Marijuana use    Discharge Instructions  Discharge Instructions     Call MD for:  difficulty breathing, headache or visual disturbances   Complete by: As directed    Call MD for:  extreme fatigue   Complete by: As directed    Call MD for:  persistant dizziness or light-headedness   Complete by: As directed    Call MD for:  persistant nausea and vomiting   Complete by: As directed    Call MD for:  severe uncontrolled pain   Complete by: As directed    Call MD for:  temperature >100.4   Complete by: As directed    Diet - low sodium heart healthy   Complete by: As directed    Increase activity slowly   Complete by: As directed       Allergies as of 08/05/2023   No Known Allergies      Medication List     STOP taking these medications    ibuprofen 800 MG tablet Commonly known as: ADVIL       TAKE these medications    divalproex 500 MG 24 hr tablet Commonly known as: DEPAKOTE ER Take 1,000 mg by mouth at bedtime.   OLANZapine 20 MG tablet Commonly known as:  ZYPREXA Take 20 mg by mouth at bedtime. What changed: Another medication with the same name was removed. Continue taking this medication, and follow the directions you see here.        No Known Allergies  Consultations: Psychiatry   Procedures/Studies: DG Chest 1 View Result Date: 08/02/2023 CLINICAL DATA:  [id number] SIRS (systemic inflammatory response syndrome) (HCC) 811914 EXAM: CHEST  1 VIEW COMPARISON:  04/09/2015. FINDINGS: Bilateral lung fields are clear. Bilateral costophrenic angles are clear. Normal cardio-mediastinal silhouette. No acute osseous abnormalities. The soft tissues are within normal limits. IMPRESSION: No active disease. Electronically Signed   By: Jules Schick M.D.   On: 08/02/2023 08:23     Subjective: Patient seen examined bedside, lying in bed.  Sitter present at bedside.  No concerns overnight.  Denies any current auditory or visual hallucinations, denies SI/HI.   No other specific complaints, questions or concerns at this time.  Denies headache, no chest pain, no shortness of breath, no abdominal pain, no myalgias.  No acute events  overnight per nursing staff.  Discharging to behavioral health Hospital for further treatment due to his acute psychosis.  Discharge Exam: Vitals:   08/05/23 0608 08/05/23 0739  BP: 121/75 121/79  Pulse: 83 66  Resp: 18 17  Temp: 97.9 F (36.6 C) 97.8 F (36.6 C)  SpO2: 100% 97%   Vitals:   08/04/23 2055 08/05/23 0039 08/05/23 0608 08/05/23 0739  BP: 119/85 102/70 121/75 121/79  Pulse: 71 66 83 66  Resp: 17 16 18 17   Temp: 97.8 F (36.6 C) 98.7 F (37.1 C) 97.9 F (36.6 C) 97.8 F (36.6 C)  TempSrc: Oral Oral Oral Oral  SpO2: 100% 98% 100% 97%  Weight:      Height:        Physical Exam: GEN: NAD, alert and oriented x 3, wd/wn HEENT: NCAT, PERRL, EOMI, sclera clear, MMM PULM: CTAB w/o wheezes/crackles, normal respiratory effort, on room air CV: RRR w/o M/G/R GI: abd soft, NTND, NABS, no R/G/M MSK: no  peripheral edema, moves all extremities independently NEURO: No focal neurological deficits PSYCH: Denies SI/HI, no auditory/visual hallucinations Integumentary: No concerning rashes/lesions/wounds noted on exposed skin surfaces    The results of significant diagnostics from this hospitalization (including imaging, microbiology, ancillary and laboratory) are listed below for reference.     Microbiology: No results found for this or any previous visit (from the past 240 hours).   Labs: BNP (last 3 results) No results for input(s): "BNP" in the last 8760 hours. Basic Metabolic Panel: Recent Labs  Lab 08/01/23 2240 08/02/23 0258 08/03/23 0534  NA 141 137 141  K 3.9 4.3 3.8  CL 108 106 111  CO2 15* 15* 24  GLUCOSE 86 70 83  BUN 24* 22* 5*  CREATININE 1.04 0.99 0.74  CALCIUM 9.8 8.3* 8.4*   Liver Function Tests: Recent Labs  Lab 08/01/23 2240 08/02/23 0258 08/03/23 0534  AST 79* 69* 43*  ALT 27 23 19   ALKPHOS 61 52 38  BILITOT 1.2 1.1 0.7  PROT 8.1 6.7 4.9*  ALBUMIN 4.8 3.8 2.6*   No results for input(s): "LIPASE", "AMYLASE" in the last 168 hours. No results for input(s): "AMMONIA" in the last 168 hours. CBC: Recent Labs  Lab 08/01/23 2240 08/03/23 0534  WBC 14.7* 8.1  NEUTROABS 11.6*  --   HGB 13.3 10.0*  HCT 40.2 29.0*  MCV 97.3 94.2  PLT 220 158   Cardiac Enzymes: Recent Labs  Lab 08/02/23 0258 08/03/23 0534  CKTOTAL 3,211* 1,692*   BNP: Invalid input(s): "POCBNP" CBG: No results for input(s): "GLUCAP" in the last 168 hours. D-Dimer No results for input(s): "DDIMER" in the last 72 hours. Hgb A1c No results for input(s): "HGBA1C" in the last 72 hours. Lipid Profile No results for input(s): "CHOL", "HDL", "LDLCALC", "TRIG", "CHOLHDL", "LDLDIRECT" in the last 72 hours. Thyroid function studies No results for input(s): "TSH", "T4TOTAL", "T3FREE", "THYROIDAB" in the last 72 hours.  Invalid input(s): "FREET3" Anemia work up No results for  input(s): "VITAMINB12", "FOLATE", "FERRITIN", "TIBC", "IRON", "RETICCTPCT" in the last 72 hours. Urinalysis    Component Value Date/Time   COLORURINE STRAW (A) 08/02/2023 1843   APPEARANCEUR CLEAR 08/02/2023 1843   LABSPEC 1.010 08/02/2023 1843   PHURINE 6.0 08/02/2023 1843   GLUCOSEU NEGATIVE 08/02/2023 1843   HGBUR LARGE (A) 08/02/2023 1843   BILIRUBINUR NEGATIVE 08/02/2023 1843   KETONESUR 5 (A) 08/02/2023 1843   PROTEINUR NEGATIVE 08/02/2023 1843   NITRITE NEGATIVE 08/02/2023 1843   LEUKOCYTESUR NEGATIVE 08/02/2023 1843  Sepsis Labs Recent Labs  Lab 08/01/23 2240 08/03/23 0534  WBC 14.7* 8.1   Microbiology No results found for this or any previous visit (from the past 240 hours).   Time coordinating discharge: Over 30 minutes  SIGNED:   Alvira Philips [place], DO  [facility] 08/05/2023, 11:29 AM

## 2023-08-06 ENCOUNTER — Encounter (HOSPITAL_COMMUNITY): Payer: Self-pay

## 2023-08-06 DIAGNOSIS — F203 Undifferentiated schizophrenia: Secondary | ICD-10-CM | POA: Diagnosis not present

## 2023-08-06 LAB — COMPREHENSIVE METABOLIC PANEL
ALT: 19 U/L (ref 0–44)
AST: 22 U/L (ref 15–41)
Albumin: 3.7 g/dL (ref 3.5–5.0)
Alkaline Phosphatase: 47 U/L (ref 38–126)
Anion gap: 8 (ref 5–15)
BUN: 14 mg/dL (ref 6–20)
CO2: 26 mmol/L (ref 22–32)
Calcium: 9.4 mg/dL (ref 8.9–10.3)
Chloride: 108 mmol/L (ref 98–111)
Creatinine, Ser: 0.76 mg/dL (ref 0.61–1.24)
GFR, Estimated: >60 mL/min (ref >60)
Glucose, Bld: 102 mg/dL — ABNORMAL HIGH (ref 70–99)
Potassium: 4.1 mmol/L (ref 3.5–5.1)
Sodium: 142 mmol/L (ref 135–145)
Total Bilirubin: 0.5 mg/dL (ref 0.0–1.2)
Total Protein: 7 g/dL (ref 6.5–8.1)

## 2023-08-06 LAB — URINALYSIS, ROUTINE W REFLEX MICROSCOPIC
Bilirubin Urine: NEGATIVE
Glucose, UA: NEGATIVE mg/dL
Hgb urine dipstick: NEGATIVE
Ketones, ur: NEGATIVE mg/dL
Leukocytes,Ua: NEGATIVE
Nitrite: NEGATIVE
Protein, ur: NEGATIVE mg/dL
Specific Gravity, Urine: 1.012 (ref 1.005–1.030)
pH: 7 (ref 5.0–8.0)

## 2023-08-06 LAB — LIPID PANEL
Cholesterol: 159 mg/dL (ref 0–200)
HDL: 47 mg/dL (ref >40)
LDL Cholesterol: 94 mg/dL (ref 0–99)
Total CHOL/HDL Ratio: 3.4 {ratio}
Triglycerides: 92 mg/dL (ref <150)
VLDL: 18 mg/dL (ref 0–40)

## 2023-08-06 LAB — HEMOGLOBIN A1C
Hgb A1c MFr Bld: 5.8 % — ABNORMAL HIGH (ref 4.8–5.6)
Mean Plasma Glucose: 119.76 mg/dL

## 2023-08-06 MED ORDER — LORAZEPAM 2 MG/ML IJ SOLN: 2.0000 mg | Freq: Three times a day (TID) | INTRAMUSCULAR | Status: DC | PRN

## 2023-08-06 MED ORDER — HALOPERIDOL LACTATE 5 MG/ML IJ SOLN: 5.0000 mg | Freq: Three times a day (TID) | INTRAMUSCULAR | Status: DC | PRN

## 2023-08-06 MED ORDER — DIPHENHYDRAMINE HCL 50 MG/ML IJ SOLN: 50.0000 mg | Freq: Three times a day (TID) | INTRAMUSCULAR | Status: DC | PRN

## 2023-08-06 MED ORDER — HALOPERIDOL LACTATE 5 MG/ML IJ SOLN: 10.0000 mg | Freq: Three times a day (TID) | INTRAMUSCULAR | Status: DC | PRN

## 2023-08-06 NOTE — [facility] Suicide Risk Assessment (Signed)
Suicide Risk Assessment  Admission Assessment    [facility] Admission Suicide Risk Assessment   Nursing information obtained from:  Patient  Demographic factors:  Male, Unemployed  Current Mental Status:  NA  Loss Factors:  NA  Historical Factors:  NA  Risk Reduction Factors:  Positive social support, Living with another person, especially a relative, Positive therapeutic relationship  Total Time spent with patient: 1.5 hours  Principal Problem: Schizophrenia (HCC)  Diagnosis:  Principal Problem:   Schizophrenia (HCC)  Subjective Data: See H&P.  Continued Clinical Symptoms:  Alcohol Use Disorder Identification Test Final Score (AUDIT): 0 The "Alcohol Use Disorders Identification Test", Guidelines for Use in Primary Care, Second Edition.  World Science writer [facility]). Score between 0-7:  no or low risk or alcohol related problems. Score between 8-15:  moderate risk of alcohol related problems. Score between 16-19:  high risk of alcohol related problems. Score 20 or above:  warrants further diagnostic evaluation for alcohol dependence and treatment.  CLINICAL FACTORS:   Alcohol/Substance Abuse/Dependencies Schizophrenia:   Paranoid or undifferentiated type More than one psychiatric diagnosis Unstable or Poor Therapeutic Relationship Previous Psychiatric Diagnoses and Treatments  Musculoskeletal: Strength & Muscle Tone: within normal limits Gait & Station: normal Patient leans: N/A  Psychiatric Specialty Exam:  Presentation  General Appearance: Casual  Eye Contact:Fair  Speech:Slow  Speech Volume:Decreased  Handedness:Right   Mood and Affect  Mood:-- (difficult to assess. Patient is unable to provide usefull information at this time.)  Affect:Appropriate   Thought Process  Thought Processes:Disorganized; Irrevelant  Descriptions of Associations:Tangential  Orientation:Partial  Thought Content:Illogical  History of Schizophrenia/Schizoaffective  disorder:No data recorded  Duration of Psychotic Symptoms:No data recorded  Hallucinations:Hallucinations: -- (Unable to assess at this time, patient seems a bit diorganized.)  Ideas of Reference:-- (Unable to assess at this time, patient seems a bit diorganized.)  Suicidal Thoughts:Suicidal Thoughts: No  Homicidal Thoughts:Homicidal Thoughts: No   Sensorium  Memory:Immediate Poor; Recent Poor; Remote Poor  Judgment:Impaired  Insight:-- (Unable to assess at this time, patient seems a bit diorganized.)   Executive Functions  Concentration:Fair  Attention Span:Fair  Recall:Poor  [employer] of Knowledge:Poor  Language:Fair   Psychomotor Activity  Psychomotor Activity:Psychomotor Activity: Normal   Assets  Assets:Resilience; Social Support   Sleep  Sleep:Sleep: Fair Number of Hours of Sleep: 6  Physical Exam: Physical Exam Vitals and nursing note reviewed.  HENT:     Nose: Nose normal.  Cardiovascular:     Rate and Rhythm: Normal rate.     Pulses: Normal pulses.  Pulmonary:     Effort: Pulmonary effort is normal.  Genitourinary:    Comments: Deferred Musculoskeletal:        General: Normal range of motion.     Cervical back: Normal range of motion.  Skin:    General: Skin is warm and dry.  Neurological:     General: No focal deficit present.     Mental Status: He is alert and oriented to person, place, and time.    Review of Systems  Constitutional:  Negative for chills, diaphoresis and fever.  HENT:  Negative for congestion and sore throat.   Respiratory:  Negative for shortness of breath and wheezing.   Cardiovascular:  Negative for chest pain, palpitations, orthopnea, claudication, leg swelling and PND.  Gastrointestinal:  Negative for abdominal pain, constipation, diarrhea, heartburn, nausea and vomiting.  Musculoskeletal:  Negative for joint pain and myalgias.  Neurological:  Negative for dizziness, tingling, tremors, sensory change, speech change,  focal weakness, seizures,  loss of consciousness, weakness and headaches.  Endo/Heme/Allergies:        NKDA  Psychiatric/Behavioral:  Positive for substance abuse (UDS (+) for THC). The patient is not nervous/anxious.    Blood pressure 117/83, pulse 94, temperature 98.3 F (36.8 C), temperature source Oral, resp. rate 18, height 5\' 10"  (1.778 m), weight 58.3 kg, SpO2 100%. Body mass index is 18.45 kg/m.  COGNITIVE FEATURES THAT CONTRIBUTE TO RISK:  Loss of executive function    SUICIDE RISK:   Moderate:  Frequent suicidal ideation with limited intensity, and duration, some specificity in terms of plans, no associated intent, good self-control, limited dysphoria/symptomatology, some risk factors present, and identifiable protective factors, including available and accessible social support.  PLAN OF CARE: See H&P.  I certify that inpatient services furnished can reasonably be expected to improve the patient's condition.   Armandina Stammer, NP, pmhnp, fnp-bc. 08/06/2023, 11:23 AM

## 2023-08-06 NOTE — Plan of Care (Signed)
  Problem: Education: Goal: Emotional status will improve Outcome: Progressing Goal: Mental status will improve Outcome: Progressing   

## 2023-08-06 NOTE — Progress Notes (Signed)
   08/06/23 0745  Psych Admission Type (Psych Patients Only)  Admission Status Involuntary  Psychosocial Assessment  Patient Complaints Suspiciousness  Eye Contact Fair  Facial Expression Flat  Affect Anxious;Preoccupied  Speech Logical/coherent  Interaction Avoidant  Motor Activity Slow  Appearance/Hygiene Disheveled  Behavior Characteristics Cooperative  Mood Preoccupied;Suspicious  Thought Process  Coherency Disorganized  Content Preoccupation  Delusions Grandeur  Perception WDL  Hallucination None reported or observed  Judgment Impaired  Confusion Mild  Danger to Self  Current suicidal ideation? Denies  Agreement Not to Harm Self Yes  Description of Agreement Verbal  Danger to Others  Danger to Others None reported or observed
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
## 2023-08-06 NOTE — Group Note (Signed)
Recreation Therapy Group Note   Group Topic:Leisure Education  Group Date: 08/06/2023 Start Time: 0933 End Time: 1009 Facilitators: Yanni Quiroa-McCall, LRT,CTRS Location: [address] Dayroom   Group Topic: Leisure Education  Goal Area(s) Addresses:  Patient will identify positive leisure activities.  Patient will identify one positive benefit of participation in leisure activities.   Intervention: Leisure Group Game  Activity: Patient, MHT, and LRT participated in playing a trivia game of Guess the [place]. LRT and patients discussed what leisure is, what examples of leisure activities are, where you can participate in leisure and why leisure is important.   Education:  Leisure Exposure, Pharmacist, community, Discharge Planning  Education Outcome: Acknowledges education/In group clarification offered/Needs additional education   Affect/Mood: Flat   Participation Level: None   Participation Quality: Independent   Behavior: Disinterested and Withdrawn   Speech/Thought Process: Barely audible    Insight: None   Judgement: None   Modes of Intervention: Competitive Play   Patient Response to Interventions:  Disengaged   Education Outcome:  In group clarification offered    Clinical Observations/Individualized Feedback: Pt stayed in group for a short time before leaving. Pt then returned for a shorter period, left group and didn't return.     Plan: Continue to engage patient in RT group sessions 2-3x/week.   Jesslynn Kruck-McCall, LRT,CTRS 08/06/2023 12:00 PM

## 2023-08-06 NOTE — BHH Counselor (Signed)
Adult Comprehensive Assessment  Patient ID: Cesar Robinson, male   DOB: 1981-05-01, 43 y.o.   MRN: [id number]  Information Source: Information source: Patient (PSA completed with pt)  Current Stressors:  Patient states their primary concerns and needs for treatment are:: " I want to be better" Patient states their goals for this hospitilization and ongoing recovery are:: " .... i hope to get better" Educational / Learning stressors: None reported Employment / Job issues: " I am looking" Family Relationships: None reported Financial / Lack of resources (include bankruptcy): None reported Housing / Lack of housing: None reported Physical health (include injuries & life threatening diseases): None reported Social relationships: None reported Substance abuse: None reported Bereavement / Loss: None reported  Living/Environment/Situation:  Living Arrangements: Parent Living conditions (as described by patient or guardian): pt could not tell me where he lived or could not tell me anything about family Who else lives in the home?: pt could not tell me where he lived or could not tell me anything about family How long has patient lived in current situation?: pt could not tell me where he lived or could not tell me anything about family  Family History:  Marital status: Single Are you sexually active?: No What is your sexual orientation?: pt could not answer Has your sexual activity been affected by drugs, alcohol, medication, or emotional stress?: pt could not answer Does patient have children?: Yes How many children?: 1 How is patient's relationship with their children?: " I am  not sure"  Childhood History:  By whom was/is the patient raised?: Mother Additional childhood history information: na Description of patient's relationship with caregiver when they were a child: ' I don't know" Patient's description of current relationship with people who raised him/her: ' I ca't remember" Does  patient have siblings?: Yes Number of Siblings: 5 Description of patient's current relationship with siblings: " it is good" Did patient suffer any verbal/emotional/physical/sexual abuse as a child?: No Did patient suffer from severe childhood neglect?: No Has patient ever been sexually abused/assaulted/raped as an adolescent or adult?: No Was the patient ever a victim of a crime or a disaster?: No Witnessed domestic violence?: No Has patient been affected by domestic violence as an adult?: No  Education:  Highest grade of school patient has completed: " I don't know' Currently a student?: No Learning disability?: No  Employment/Work Situation:   Employment Situation: Unemployed Patient's Job has Been Impacted by Current Illness: No What is the Longest Time Patient has Held a Job?: 8 months Where was the Patient Employed at that Time?: [employer]" Has Patient ever Been in the [employer]?: No  Financial Resources:   Financial resources: No income Does patient have a Lawyer or guardian?: No  Alcohol/Substance Abuse:   What has been your use of drugs/alcohol within the last 12 months?: " I used weed a couple of days ago" If attempted suicide, did drugs/alcohol play a role in this?: No If yes, describe treatment: na Has alcohol/substance abuse ever caused legal problems?: No  Social Support System:   Conservation officer, nature Support System: Poor Describe Community Support System: " I go to [employer]" Type of faith/religion: " I am a Christian" How does patient's faith help to cope with current illness?: " I don't know"  Leisure/Recreation:   Do You Have Hobbies?: Yes Leisure and Hobbies: " I like playing video games"  Strengths/Needs:   What is the patient's perception of their strengths?: " I don't know Patient states they can  use these personal strengths during their treatment to contribute to their recovery: " I don't know Patient states these barriers may  affect/interfere with their treatment: " I don't know Patient states these barriers may affect their return to the community: " I don't know Other important information patient would like considered in planning for their treatment: na  Discharge Plan:   Currently receiving community mental health services: No Patient states concerns and preferences for aftercare planning are: "  I don't know" Patient states they will know when they are safe and ready for discharge when: " I don't know Does patient have access to transportation?: Yes Does patient have financial barriers related to discharge medications?: No Patient description of barriers related to discharge medications: " I don't know Will patient be returning to same living situation after discharge?: Yes  Summary/Recommendations:   Summary and Recommendations (to be completed by the evaluator): Rolm Gala is 43 year old male voluntarily admitted to [facility] after presenting to [facility] due to noncompliance of medications. Pt 's twin reported pt left the home and was found standing on the lawn of an unknown home. Pt could not answer questions regarding childhood, family, or personal information. Pt declined consents to speak with family. Pt reported he is followed by [facility] for medication management and is prescribed Depakote and Zyprexa. CSW will continue to follow.  Dresden Lozito R. 08/06/2023

## 2023-08-06 NOTE — Group Note (Signed)
Date:  08/06/2023 Time:  1:43 PM  Group Topic/Focus:  Goals Group:   The focus of this group is to help patients establish daily goals to achieve during treatment and discuss how the patient can incorporate goal setting into their daily lives to aide in recovery. Orientation:   The focus of this group is to educate the patient on the purpose and policies of crisis stabilization and provide a format to answer questions about their admission.  The group details unit policies and expectations of patients while admitted.    Participation Level:  Minimal  Participation Quality:  Attentive  Affect:  Flat  Cognitive:  Oriented  Insight: None  Engagement in Group:  Limited  Modes of Intervention:  Discussion, Orientation, and Rapport Building  Additional Comments:   Pt attended the Orientation/Goals group.  Pt did not contribute to discussion, provide insight or disclose about personal goals, however was calm, cooperative and attentive throughout the group.   Edmund Hilda Ceciley Buist 08/06/2023, 1:43 PM

## 2023-08-06 NOTE — Group Note (Signed)
Date:  08/06/2023 Time:  4:42 PM  Group Topic/Focus:  Dimensions of Wellness:   The focus of this group is to introduce the topic of wellness and discuss the role each dimension of wellness plays in total health.    Participation Level:  Did Not Attend  Participation Quality:   n/a  Affect:   n/a  Cognitive:   n/a  Insight: None  Engagement in Group:   n/a  Modes of Intervention:   n/a  Additional Comments:   Pt did not attend.  Cesar Robinson 08/06/2023, 4:42 PM

## 2023-08-06 NOTE — Progress Notes (Signed)
Pt in bed, isolative.  Pt denied attending groups only stating he felt sluggish.  Pt initially denied medications but later agreed and took them.  Pt displayed paranoid behavior by trying to open door at the end of the hall.  He told writer someone was trying to get him.  Pt also stated he had a baby carriage in his stomach.  Pt denied SI, HI and AVH.  Pt continues to be monitored for safety.  Pt safe on unit.

## 2023-08-06 NOTE — BH IP Treatment Plan (Signed)
Interdisciplinary Treatment and Diagnostic Plan Update  08/06/2023 Time of Session: 10:40AM Cesar Robinson MRN: [id number]  Principal Diagnosis: Schizophrenia [facility])  Secondary Diagnoses: Principal Problem:   Schizophrenia (HCC)   Current Medications:  Current Facility-Administered Medications  Medication Dose Route Frequency Provider Last Rate Last Admin   acetaminophen (TYLENOL) tablet 650 mg  650 mg Oral Q6H PRN Peterson Ao, MD       albuterol (PROVENTIL) (2.5 MG/3ML) 0.083% nebulizer solution 2.5 mg  2.5 mg Nebulization Q6H PRN Peterson Ao, MD       alum & mag hydroxide-simeth (MAALOX/MYLANTA) 200-200-20 MG/5ML suspension 30 mL  30 mL Oral Q4H PRN Peterson Ao, MD       haloperidol (HALDOL) tablet 5 mg  5 mg Oral TID PRN Peterson Ao, MD       And   diphenhydrAMINE (BENADRYL) capsule 50 mg  50 mg Oral TID PRN Peterson Ao, MD       haloperidol lactate (HALDOL) injection 5 mg  5 mg Intramuscular TID PRN Massengill, Harrold Donath, MD       And   diphenhydrAMINE (BENADRYL) injection 50 mg  50 mg Intramuscular TID PRN Massengill, Harrold Donath, MD       And   LORazepam (ATIVAN) injection 2 mg  2 mg Intramuscular TID PRN Massengill, Harrold Donath, MD       haloperidol lactate (HALDOL) injection 10 mg  10 mg Intramuscular TID PRN Massengill, Harrold Donath, MD       And   diphenhydrAMINE (BENADRYL) injection 50 mg  50 mg Intramuscular TID PRN Massengill, Harrold Donath, MD       And   LORazepam (ATIVAN) injection 2 mg  2 mg Intramuscular TID PRN Phineas Inches, MD       divalproex (DEPAKOTE ER) 24 hr tablet 1,000 mg  1,000 mg Oral QHS Peterson Ao, MD   1,000 mg at 08/05/23 2112   magnesium hydroxide (MILK OF MAGNESIA) suspension 30 mL  30 mL Oral Daily PRN Peterson Ao, MD       nicotine (NICODERM CQ - dosed in mg/24 hours) patch 14 mg  14 mg Transdermal Daily Peterson Ao, MD   14 mg at 08/06/23 0918   OLANZapine (ZYPREXA) tablet 20 mg  20 mg Oral QHS Peterson Ao, MD   20 mg at 08/05/23  2112   PTA Medications: Medications Prior to Admission  Medication Sig Dispense Refill Last Dose/Taking   divalproex (DEPAKOTE ER) 500 MG 24 hr tablet Take 1,000 mg by mouth at bedtime. (Patient not taking: Reported on 08/02/2023)      OLANZapine (ZYPREXA) 20 MG tablet Take 20 mg by mouth at bedtime. (Patient not taking: Reported on 08/02/2023)       Patient Stressors: Medication change or noncompliance    Patient Strengths: Average or above average intelligence  Physical Health  Supportive family/friends   Treatment Modalities: Medication Management, Group therapy, Case management,  1 to 1 session with clinician, Psychoeducation, Recreational therapy.   Physician Treatment Plan for Primary Diagnosis: Schizophrenia (HCC) Long Term Goal(s):     Short Term Goals:    Medication Management: Evaluate patient's response, side effects, and tolerance of medication regimen.  Therapeutic Interventions: 1 to 1 sessions, Unit Group sessions and Medication administration.  Evaluation of Outcomes: Not Progressing  Physician Treatment Plan for Secondary Diagnosis: Principal Problem:   Schizophrenia (HCC)  Long Term Goal(s):     Short Term Goals:       Medication Management: Evaluate patient's response, side effects, and tolerance of medication regimen.  Therapeutic Interventions: 1 to 1 sessions, Unit Group sessions and Medication administration.  Evaluation of Outcomes: Not Progressing   RN Treatment Plan for Primary Diagnosis: Schizophrenia (HCC) Long Term Goal(s): Knowledge of disease and therapeutic regimen to maintain health will improve  Short Term Goals: Ability to remain free from injury will improve, Ability to verbalize frustration and anger appropriately will improve, Ability to demonstrate self-control, Ability to participate in decision making will improve, Ability to verbalize feelings will improve, Ability to disclose and discuss suicidal ideas, Ability to identify and  develop effective coping behaviors will improve, and Compliance with prescribed medications will improve  Medication Management: RN will administer medications as ordered by provider, will assess and evaluate patient's response and provide education to patient for prescribed medication. RN will report any adverse and/or side effects to prescribing provider.  Therapeutic Interventions: 1 on 1 counseling sessions, Psychoeducation, Medication administration, Evaluate responses to treatment, Monitor vital signs and CBGs as ordered, Perform/monitor CIWA, COWS, AIMS and Fall Risk screenings as ordered, Perform wound care treatments as ordered.  Evaluation of Outcomes: Not Progressing   LCSW Treatment Plan for Primary Diagnosis: Schizophrenia (HCC) Long Term Goal(s): Safe transition to appropriate next level of care at discharge, Engage patient in therapeutic group addressing interpersonal concerns.  Short Term Goals: Engage patient in aftercare planning with referrals and resources, Increase social support, Increase ability to appropriately verbalize feelings, Increase emotional regulation, Facilitate acceptance of mental health diagnosis and concerns, Facilitate patient progression through stages of change regarding substance use diagnoses and concerns, Identify triggers associated with mental health/substance abuse issues, and Increase skills for wellness and recovery  Therapeutic Interventions: Assess for all discharge needs, 1 to 1 time with Social worker, Explore available resources and support systems, Assess for adequacy in community support network, Educate family and significant other(s) on suicide prevention, Complete Psychosocial Assessment, Interpersonal group therapy.  Evaluation of Outcomes: Not Progressing   Progress in Treatment: Attending groups: No. Participating in groups: No. Taking medication as prescribed: None scheduled yet Toleration medication: None scheduled  yet Family/Significant other contact made: No, will contact:  consents pending Patient understands diagnosis: Yes. Discussing patient identified problems/goals with staff: Yes. Medical problems stabilized or resolved: Yes. Denies suicidal/homicidal ideation: Yes. Issues/concerns per patient self-inventory: No.  New problem(s) identified: No, Describe:  none  New Short Term/Long Term Goal(s): medication stabilization, elimination of SI thoughts, development of comprehensive mental wellness plan.    Patient Goals:  "Good wellbeing"  Discharge Plan or Barriers: Patient recently admitted. CSW will continue to follow and assess for appropriate referrals and possible discharge planning.    Reason for Continuation of Hospitalization: Delusions  Hallucinations Medication stabilization Other; describe paranoia  Estimated Length of Stay: 5-7 days  Last 3 [place] Suicide Severity Risk Score: Flowsheet Row [facility]Current[facility]-Admission (Discharged) from 08/01/2023 in [place] [facility]  C-SSRS RISK CATEGORY No Risk No Risk       Last PHQ 2/9 Scores:     No data to display          Scribe for Treatment Team: Kathi Der, LCSWA 08/06/2023 11:24 AM

## 2023-08-07 DIAGNOSIS — F200 Paranoid schizophrenia: Secondary | ICD-10-CM | POA: Diagnosis not present

## 2023-08-07 LAB — CK: Total CK: 255 U/L (ref 49–397)

## 2023-08-07 LAB — TSH: TSH: 3.219 u[IU]/mL (ref 0.350–4.500)

## 2023-08-07 LAB — HEMOGLOBIN A1C
Hgb A1c MFr Bld: 5.7 % — ABNORMAL HIGH (ref 4.8–5.6)
Mean Plasma Glucose: 116.89 mg/dL

## 2023-08-07 MED ORDER — TRAZODONE HCL 50 MG PO TABS
50.0000 mg | ORAL_TABLET | Freq: Every day | ORAL | Status: DC
  Administered 2023-08-07 – 2023-08-12 (×6): 50 mg via ORAL
  Filled 2023-08-07 (×12): qty 1

## 2023-08-07 NOTE — B[facility] Group Notes (Signed)
Pt did not attend goals group. 

## 2023-08-07 NOTE — Plan of Care (Signed)
  Problem: Education: Goal: Mental status will improve Outcome: Progressing   

## 2023-08-07 NOTE — Progress Notes (Signed)
   08/07/23 0815  Psych Admission Type (Psych Patients Only)  Admission Status Involuntary  Psychosocial Assessment  Patient Complaints Suspiciousness  Eye Contact Brief  Facial Expression Flat  Affect Anxious;Preoccupied  Speech Logical/coherent  Interaction Isolative  Motor Activity Slow  Appearance/Hygiene Unremarkable  Behavior Characteristics Guarded  Mood Suspicious  Thought Process  Coherency Disorganized  Content Delusions  Delusions Paranoid  Perception WDL  Hallucination None reported or observed  Judgment Impaired  Confusion Mild  Danger to Self  Current suicidal ideation? Denies  Agreement Not to Harm Self Yes  Danger to Others  Danger to Others None reported or observed

## 2023-08-07 NOTE — [facility] Group Notes (Signed)
Type of Therapy and Topic:  Group Therapy:  Boundaries  Participation Level:  Did Not Attend   Description of Group:  Patients in this group were introduced to  3 types of healthy boundaries to address the extent to which we allow others into our lives, how we communicate, and where we draw the line when it comes to our personal space, emotions, and needs.  Different types of boundaries were defined and described, and each type was discussed with understanding.  Patients discussed what additional healthy boundaries could be helpful in their recovery and wellness after discharge in order to prevent future hospitalizations.   An emphasis was placed on following up with the discharge plan when they leave the hospital in order to continue becoming healthier and happier.    Therapeutic Goals: 1)  demonstrate and identify the type of unhealthy and healthy boundaries  2)  discuss healthy boundaries and how to address it  3)  identify the patient's current level of healthy boundaries and   4)  elicit communicating healthy boundaries and trust    Summary of Patient Progress:  NA  Therapeutic Modalities:   Psychoeducation Brief Solution-Focused Therapy

## 2023-08-07 NOTE — Progress Notes (Signed)
[facility] MD Progress Note  08/07/2023 9:37 AM Cesar Robinson  MRN:  [id number]  Subjective:  The patient is a 43 year old male with a history of schizophrenia presents to the emergency department escorted by [employer]. Much of the history is provided by police secondary to acute psychotic episode. Per [employer], patient's brother notes noncompliance with psychiatric medications for an unknown length of time. The patient left his home today and was found standing outside in the lawn of an unknown home. Police tried to transport the patient back to his residence where the patient did not recognize his twin brother and said that his brother tried to kill his mother. The patient has been exceedingly paranoid with police. Not making any suicidal or homicidal remarks. Challenging to redirect, but nonviolent.  Yesterday the psychiatry team made the following recommendations: -Continue Olanzapine 20 mg po for mood control (home medication).  -Continue Depakote ER 1,000 mg po Q bedtime for mood stabilization (home med).   On assessment today, the patient continues report history that is incongruent with the collateral information obtained when he was hospitalized on the medical floor.  He is inconsistent in his history giving, saying that prior to admission he was living with his girlfriend.  But then he was living with someone else that he was unsure of.  Denies that he has a twin brother was visiting or living with his full brother.  Also denies any previously lived with his mother.  Per nursing he is disoriented, and thought last night that he was leaving the unit, was at the back door trying to leave the unit.  Patient unsure why he is in the psychiatric hospital and does not recall the circumstances leading up to him being admitted to the medical hospital.  Patient is unsure who manages his medications outside the hospital, per collateral this is the mother.  Patient denies any substance use leading up to this admission but UDS  is positive for marijuana.  Per nursing continues to place up and down the halls.  Patient denies any AH or VH to me today.  Denies any SI or HI.  Denies any side effects to his current psychiatric medications.  He does state that he would prefer to continue the oral medication and does not want an LAI.  I discussed with him his lab results this morning, his CK has normalized.  Discussed that we will need a valproic acid level, ammonia level, and LFTs, and he is agreeable to getting these tomorrow morning.  He otherwise has no questions in regards to his hospitalization or treatment.   Principal Problem: Schizophrenia (HCC) Diagnosis: Principal Problem:   Schizophrenia (HCC)  Total Time spent with patient: 20 minutes  Past Psychiatric History:  Per H&P, patient previously diagnosed with schizophrenia and was taking Zyprexa and Depakote as outpatient.  He has 1 previous hospitalization but has been apparently stable for about 10 years.  Past Medical History:  Past Medical History:  Diagnosis Date   Schizophrenia (HCC)    Sleep apnea    History reviewed. No pertinent surgical history. Family History: History reviewed. No pertinent family history. Family Psychiatric  History: See H&P Social History:  Social History   Substance and Sexual Activity  Alcohol Use Not Currently   Comment: occ     Social History   Substance and Sexual Activity  Drug Use Yes   Types: Marijuana   Comment: Uses "a couple weekends"    Social History   Socioeconomic History   Marital  status: Single    Spouse name: Not on file   Number of children: Not on file   Years of education: Not on file   Highest education level: Not on file  Occupational History   Occupation: unemployed  Tobacco Use   Smoking status: Every Day    Current packs/day: 0.25    Types: Cigarettes   Smokeless tobacco: Not on file   Tobacco comments:    Black and Mild, declines cessation  Vaping Use   Vaping status: Never Used   Substance and Sexual Activity   Alcohol use: Not Currently    Comment: occ   Drug use: Yes    Types: Marijuana    Comment: Uses "a couple weekends"   Sexual activity: Yes  Other Topics Concern   Not on file  Social History Narrative   Not on file   Social Drivers of Health   Financial Resource Strain: Not on file  Food Insecurity: Food Insecurity Present (08/05/2023)   Hunger Vital Sign    Worried About Running Out of Food in the Last Year: Patient unable to answer    Ran Out of Food in the Last Year: Sometimes true  Transportation Needs: Patient Unable To Answer (08/05/2023)   PRAPARE - Administrator, Civil Service (Medical): Patient unable to answer    Lack of Transportation (Non-Medical): Patient unable to answer  Physical Activity: Not on file  Stress: Not on file  Social Connections: Not on file   Additional Social History:                          Current Medications: Current Facility-Administered Medications  Medication Dose Route Frequency Provider Last Rate Last Admin   acetaminophen (TYLENOL) tablet 650 mg  650 mg Oral Q6H PRN Peterson Ao, MD       albuterol (PROVENTIL) (2.5 MG/3ML) 0.083% nebulizer solution 2.5 mg  2.5 mg Nebulization Q6H PRN Peterson Ao, MD       alum & mag hydroxide-simeth (MAALOX/MYLANTA) 200-200-20 MG/5ML suspension 30 mL  30 mL Oral Q4H PRN Peterson Ao, MD       haloperidol (HALDOL) tablet 5 mg  5 mg Oral TID PRN Peterson Ao, MD       And   diphenhydrAMINE (BENADRYL) capsule 50 mg  50 mg Oral TID PRN Peterson Ao, MD       haloperidol lactate (HALDOL) injection 5 mg  5 mg Intramuscular TID PRN Quintarius Ferns, Harrold Donath, MD       And   diphenhydrAMINE (BENADRYL) injection 50 mg  50 mg Intramuscular TID PRN Chessica Audia, Harrold Donath, MD       And   LORazepam (ATIVAN) injection 2 mg  2 mg Intramuscular TID PRN Jewell Ryans, Harrold Donath, MD       haloperidol lactate (HALDOL) injection 10 mg  10 mg Intramuscular TID PRN  Dashauna Heymann, Harrold Donath, MD       And   diphenhydrAMINE (BENADRYL) injection 50 mg  50 mg Intramuscular TID PRN Royetta Probus, Harrold Donath, MD       And   LORazepam (ATIVAN) injection 2 mg  2 mg Intramuscular TID PRN Lakelyn Straus, Harrold Donath, MD       divalproex (DEPAKOTE ER) 24 hr tablet 1,000 mg  1,000 mg Oral QHS Peterson Ao, MD   1,000 mg at 08/06/23 2239   magnesium hydroxide (MILK OF MAGNESIA) suspension 30 mL  30 mL Oral Daily PRN Peterson Ao, MD       nicotine (NICODERM CQ -  dosed in mg/24 hours) patch 14 mg  14 mg Transdermal Daily Peterson Ao, MD   14 mg at 08/06/23 0918   OLANZapine (ZYPREXA) tablet 20 mg  20 mg Oral QHS Peterson Ao, MD   20 mg at 08/06/23 2240    Lab Results:  Results for orders placed or performed during the hospital encounter of 08/05/23 (from the past 48 hours)  Comprehensive metabolic panel     Status: Abnormal   Collection Time: 08/06/23  6:30 AM  Result Value Ref Range   Sodium 142 135 - 145 mmol/L   Potassium 4.1 3.5 - 5.1 mmol/L   Chloride 108 98 - 111 mmol/L   CO2 26 22 - 32 mmol/L   Glucose, Bld 102 (H) 70 - 99 mg/dL    Comment: Glucose reference range applies only to samples taken after fasting for at least 8 hours.   BUN 14 6 - 20 mg/dL   Creatinine, Ser 4.09 0.61 - 1.24 mg/dL   Calcium 9.4 8.9 - 81.1 mg/dL   Total Protein 7.0 6.5 - 8.1 g/dL   Albumin 3.7 3.5 - 5.0 g/dL   AST 22 15 - 41 U/L   ALT 19 0 - 44 U/L   Alkaline Phosphatase 47 38 - 126 U/L   Total Bilirubin 0.5 0.0 - 1.2 mg/dL   GFR, Estimated >91 >47 mL/min    Comment: (NOTE) Calculated using the CKD-EPI Creatinine Equation (2021)    Anion gap 8 5 - 15    Comment: Performed at [facility], [address] [place], [place] [address]  Hemoglobin A1c     Status: Abnormal   Collection Time: 08/06/23  6:30 AM  Result Value Ref Range   Hgb A1c MFr Bld 5.8 (H) 4.8 - 5.6 %    Comment: (NOTE) Pre diabetes:          5.7%-6.4%  Diabetes:               >6.4%  Glycemic control for   <7.0% adults with diabetes    Mean Plasma Glucose 119.76 mg/dL    Comment: Performed at [facility], [address], [place], [place] [address]  Lipid panel     Status: None   Collection Time: 08/06/23  6:30 AM  Result Value Ref Range   Cholesterol 159 0 - 200 mg/dL   Triglycerides 92 <657 mg/dL   HDL 47 >84 mg/dL   Total CHOL/HDL Ratio 3.4 RATIO   VLDL 18 0 - 40 mg/dL   LDL Cholesterol 94 0 - 99 mg/dL    Comment:        Total Cholesterol/HDL:CHD Risk Coronary Heart Disease Risk Table                     Men   Women  1/2 Average Risk   3.4   3.3  Average Risk       5.0   4.4  2 X Average Risk   9.6   7.1  3 X Average Risk  23.4   11.0        Use the calculated Patient Ratio above and the CHD Risk Table to determine the patient's CHD Risk.        ATP III CLASSIFICATION (LDL):  <100     mg/dL   Optimal  696-295  mg/dL   Near or Above  Optimal  130-159  mg/dL   Borderline  132-440  mg/dL   High  >102     mg/dL   Very High Performed at [facility], [address] [place], [place] [address]   Urinalysis, Routine w reflex microscopic -Urine, Clean Catch     Status: Abnormal   Collection Time: 08/06/23  1:39 PM  Result Value Ref Range   Color, Urine STRAW (A) YELLOW   APPearance CLEAR CLEAR   Specific Gravity, Urine 1.012 1.005 - 1.030   pH 7.0 5.0 - 8.0   Glucose, UA NEGATIVE NEGATIVE mg/dL   Hgb urine dipstick NEGATIVE NEGATIVE   Bilirubin Urine NEGATIVE NEGATIVE   Ketones, ur NEGATIVE NEGATIVE mg/dL   Protein, ur NEGATIVE NEGATIVE mg/dL   Nitrite NEGATIVE NEGATIVE   Leukocytes,Ua NEGATIVE NEGATIVE    Comment: Performed at [facility], [address], [place], [place] [address]  TSH     Status: None   Collection Time: 08/07/23  6:24 AM  Result Value Ref Range   TSH 3.219 0.350 - 4.500 uIU/mL    Comment: Performed by a 3rd Generation assay with a functional sensitivity of  <=0.01 uIU/mL. Performed at [facility], [address], [place], [place] [address]   CK     Status: None   Collection Time: 08/07/23  6:24 AM  Result Value Ref Range   Total CK 255 49 - 397 U/L    Comment: Performed at [facility], [address] [place], [place] [address]    Blood Alcohol level:  Lab Results  Component Value Date   ETH <10 08/01/2023    Metabolic Disorder Labs: Lab Results  Component Value Date   HGBA1C 5.8 (H) 08/06/2023   MPG 119.76 08/06/2023   No results found for: "PROLACTIN" Lab Results  Component Value Date   CHOL 159 08/06/2023   TRIG 92 08/06/2023   HDL 47 08/06/2023   CHOLHDL 3.4 08/06/2023   VLDL 18 08/06/2023   LDLCALC 94 08/06/2023    Physical Findings: AIMS:  , ,  ,  ,    CIWA:    COWS:     Musculoskeletal: Strength & Muscle Tone: within normal limits Gait & Station: normal Patient leans: N/A  Psychiatric Specialty Exam:  Presentation  General Appearance:  Bizarre; Disheveled  Eye Contact: Fleeting  Speech: Garbled  Speech Volume: Decreased  Handedness: Right   Mood and Affect  Mood: Anxious  Affect: Constricted   Thought Process  Thought Processes: Disorganized  Descriptions of Associations:Tangential  Orientation:Partial  Thought Content:Illogical; Delusions  History of Schizophrenia/Schizoaffective disorder:Yes  Duration of Psychotic Symptoms:Greater than six months  Hallucinations:Hallucinations: None  Ideas of Reference:Delusions  Suicidal Thoughts:Suicidal Thoughts: No  Homicidal Thoughts:Homicidal Thoughts: No   Sensorium  Memory: Immediate Poor; Recent Poor; Remote Fair  Judgment: Impaired  Insight: Lacking   Executive Functions  Concentration: Poor  Attention Span: Poor  Recall: Poor  Fund of Knowledge: Poor  Language: Fair   Psychomotor Activity  Psychomotor Activity: Psychomotor Activity: Normal   Assets   Assets: Resilience; Social Support   Sleep  Sleep: Sleep: Fair Number of Hours of Sleep: 6    Physical Exam: Physical Exam Vitals reviewed.  Constitutional:      General: He is not in acute distress.    Appearance: He is normal weight. He is not toxic-appearing.  Pulmonary:     Effort: Pulmonary effort is normal. No respiratory distress.  Neurological:  Mental Status: He is alert.     Motor: No weakness.     Gait: Gait normal.    Review of Systems  Constitutional:  Negative for chills and fever.  Cardiovascular:  Negative for chest pain and palpitations.  Neurological:  Negative for dizziness, tingling, tremors and headaches.  Psychiatric/Behavioral:  Negative for depression, hallucinations, memory loss, substance abuse and suicidal ideas. The patient is not nervous/anxious and does not have insomnia.        +Delusional  All other systems reviewed and are negative.  Blood pressure 107/88, pulse 86, temperature 98.3 F (36.8 C), temperature source Oral, resp. rate 18, height 5\' 10"  (1.778 m), weight 58.3 kg, SpO2 100%. Body mass index is 18.45 kg/m.   Treatment Plan Summary: Daily contact with patient to assess and evaluate symptoms and progress in treatment and Medication management  Assessment: Principal/active diagnoses: Schizophrenia, paranoid type  Plan: The risks/benefits/side-effects/alternatives to the medications in use were discussed in detail with the patient and time was given for patient's questions. The patient consents to medication trial.     Safety and Monitoring: Involuntary admission to inpatient psychiatric unit for safety, stabilization and treatment Daily contact with patient to assess and evaluate symptoms and progress in treatment Patient's case to be discussed in multi-disciplinary team meeting Observation Level : q15 minute checks Vital signs: q12 hours Precautions: Safety   Medication plan: -Continue Olanzapine 20 mg po for mood  control (home medication).  Patient declining any change to his antipsychotic regimen, but can could put him on track for an LAI -Continue Depakote ER 1,000 mg po Q bedtime for mood stabilization (home med).  -Continue Albuterol solution 2.5 mg Q 6 hours prn for SOB.  -Continue Nicotine patch 14 mg trans-dermally Q 24 hours for nicotine withdrawal.   CK has down trended to normal range We will repeat valproic acid level, and actually, LFTs with concern for confusion and disorientation, that waxes and wanes   Agitation protocol: ---Haldol 5 mg, oral, 3 times daily as needed, mild agitation --Benadryl 50 mg, oral, 3 times daily as needed, mild agitation      OR                                --Haldol injection 5 mg, IM, 3 times daily as needed, moderate agitation --Benadryl injection 50 mg, IM, 3 times daily as needed, moderate agitation --Ativan injection 2 mg, IM, 3 times daily as needed, moderate agitation         OR                             --Haldol injection 5 mg, IM, 3 times daily as needed, severe agitation --Benadryl injection 50 mg, IM, 3 times daily as needed, severe agitation --Ativan injection 2 mg, IM, 3 times daily as needed, severe agitation   Other PRNS -Continue Tylenol 650 mg every 6 hours PRN for mild pain -Continue Maalox 30 ml Q 4 hrs PRN for indigestion -Continue MOM 30 ml po Q 6 hrs for constipation  Discharge Planning: Social work and case management to assist with discharge planning and identification of hospital follow-up needs prior to discharge Estimated LOS: 5-7 days Discharge Concerns: Need to establish a safety plan; Medication compliance and effectiveness Discharge Goals: Return home with outpatient referrals for mental health follow-up including medication management/psychotherapy   Phineas Inches, MD 08/07/2023, 9:37  AM  Total Time Spent in Direct Patient Care:  I personally spent 35 minutes on the unit in direct patient care. The direct  patient care time included face-to-face time with the patient, reviewing the patient's chart, communicating with other professionals, and coordinating care. Greater than 50% of this time was spent in counseling or coordinating care with the patient regarding goals of hospitalization, psycho-education, and discharge planning needs.   Phineas Inches, MD Psychiatrist

## 2023-08-07 NOTE — [facility] Group Notes (Signed)
[facility] Group Notes:  (Nursing)  Date:  08/07/2023  Time: 1400  Type of Therapy:  Psychoeducational Skills  Participation Level:  Did Not Attend   Shela Nevin 08/07/2023, 3:34 PM

## 2023-08-08 DIAGNOSIS — F200 Paranoid schizophrenia: Secondary | ICD-10-CM | POA: Diagnosis not present

## 2023-08-08 LAB — HEPATIC FUNCTION PANEL
ALT: 16 U/L (ref 0–44)
AST: 17 U/L (ref 15–41)
Albumin: 3.6 g/dL (ref 3.5–5.0)
Alkaline Phosphatase: 45 U/L (ref 38–126)
Bilirubin, Direct: 0.2 mg/dL (ref 0.0–0.2)
Indirect Bilirubin: 0.7 mg/dL (ref 0.3–0.9)
Total Bilirubin: 0.9 mg/dL (ref 0.0–1.2)
Total Protein: 6.8 g/dL (ref 6.5–8.1)

## 2023-08-08 LAB — AMMONIA: Ammonia: 50 umol/L — ABNORMAL HIGH (ref 9–35)

## 2023-08-08 LAB — VALPROIC ACID LEVEL: Valproic Acid Lvl: 72 ug/mL (ref 50.0–100.0)

## 2023-08-08 MED ORDER — LACTULOSE 10 GM/15ML PO SOLN
10.0000 g | Freq: Two times a day (BID) | ORAL | Status: DC
  Administered 2023-08-08 – 2023-08-10 (×5): 10 g via ORAL
  Filled 2023-08-08 (×20): qty 15

## 2023-08-08 NOTE — Group Note (Signed)
Date:  08/08/2023 Time:  1:28 AM  Group Topic/Focus:  Wrap-Up Group:   The focus of this group is to help patients review their daily goal of treatment and discuss progress on daily workbooks.    Participation Level:  Did Not Attend  Participation Quality:   N/A  Affect:   N/A  Cognitive:   N/A  Insight: None  Engagement in Group:   N/A  Modes of Intervention:   N/A  Additional Comments:  Patient did not attend wrap up group.  Kennieth Francois 08/08/2023, 1:28 AM

## 2023-08-08 NOTE — Progress Notes (Addendum)
[facility] MD Progress Note  08/08/2023 7:44 AM Cesar Robinson  MRN:  [id number]  Subjective:  The patient is a 43 year old male with a history of schizophrenia presents to the emergency department escorted by [employer]. Much of the history is provided by police secondary to acute psychotic episode. Per [employer], patient's brother notes noncompliance with psychiatric medications for an unknown length of time. The patient left his home today and was found standing outside in the lawn of an unknown home. Police tried to transport the patient back to his residence where the patient did not recognize his twin brother and said that his brother tried to kill his mother. The patient has been exceedingly paranoid with police. Not making any suicidal or homicidal remarks. Challenging to redirect, but nonviolent.  Yesterday the psychiatry team made the following recommendations: -Continue Olanzapine 20 mg po for mood control (home medication).  -Continue Depakote ER 1,000 mg po Q bedtime for mood stabilization (home med).   Per nursing report - pt is paranoid, confused off and on  On assessment today, patient reports that his mood is stable.  Reports sleep was okay last night but he woke up around 4 AM, and cannot return to sleep.  Reports energy is okay and denies any fatigue.  Denies any side effects to his current psychiatric medications.  He continues to be unsure of why he is in the hospital.  He is anima x 2, believes the date is August 14, 2023, but is aware he is at [facility].  He is unaware of why he is in the hospital.  It is unclear if the patient is approaching baseline or not.  Patient states that this writer cannot call his mother today but does not provide a reason for this.  I encouraged the patient to call his mother and also encouraged his mother to visit, to help the team determine if the patient is approaching baseline or not, or needs further medication intervention.  Otherwise he denies any AH or VH.  Denies any  other psychotic symptoms.  Does not appear to be overtly paranoid, suspicious other.  Denies feeling unsafe or that others are out to get him inside or outside of the hospital.  Denies any SI or HI.     Principal Problem: Schizophrenia (HCC) Diagnosis: Principal Problem:   Schizophrenia (HCC)  Total Time spent with patient: 20 minutes  Past Psychiatric History:  Per H&P, patient previously diagnosed with schizophrenia and was taking Zyprexa and Depakote as outpatient.  He has 1 previous hospitalization but has been apparently stable for about 10 years.  Past Medical History:  Past Medical History:  Diagnosis Date   Schizophrenia (HCC)    Sleep apnea    History reviewed. No pertinent surgical history. Family History: History reviewed. No pertinent family history. Family Psychiatric  History: See H&P Social History:  Social History   Substance and Sexual Activity  Alcohol Use Not Currently   Comment: occ     Social History   Substance and Sexual Activity  Drug Use Yes   Types: Marijuana   Comment: Uses "a couple weekends"    Social History   Socioeconomic History   Marital status: Single    Spouse name: Not on file   Number of children: Not on file   Years of education: Not on file   Highest education level: Not on file  Occupational History   Occupation: unemployed  Tobacco Use   Smoking status: Every Day    Current  packs/day: 0.25    Types: Cigarettes   Smokeless tobacco: Not on file   Tobacco comments:    Black and Mild, declines cessation  Vaping Use   Vaping status: Never Used  Substance and Sexual Activity   Alcohol use: Not Currently    Comment: occ   Drug use: Yes    Types: Marijuana    Comment: Uses "a couple weekends"   Sexual activity: Yes  Other Topics Concern   Not on file  Social History Narrative   Not on file   Social Drivers of Health   Financial Resource Strain: Not on file  Food Insecurity: Food Insecurity Present (08/05/2023)    Hunger Vital Sign    Worried About Running Out of Food in the Last Year: Patient unable to answer    Ran Out of Food in the Last Year: Sometimes true  Transportation Needs: Patient Unable To Answer (08/05/2023)   PRAPARE - Administrator, Civil Service (Medical): Patient unable to answer    Lack of Transportation (Non-Medical): Patient unable to answer  Physical Activity: Not on file  Stress: Not on file  Social Connections: Not on file   Additional Social History:                          Current Medications: Current Facility-Administered Medications  Medication Dose Route Frequency Provider Last Rate Last Admin   acetaminophen (TYLENOL) tablet 650 mg  650 mg Oral Q6H PRN Peterson Ao, MD       albuterol (PROVENTIL) (2.5 MG/3ML) 0.083% nebulizer solution 2.5 mg  2.5 mg Nebulization Q6H PRN Peterson Ao, MD       alum & mag hydroxide-simeth (MAALOX/MYLANTA) 200-200-20 MG/5ML suspension 30 mL  30 mL Oral Q4H PRN Peterson Ao, MD       haloperidol (HALDOL) tablet 5 mg  5 mg Oral TID PRN Peterson Ao, MD       And   diphenhydrAMINE (BENADRYL) capsule 50 mg  50 mg Oral TID PRN Peterson Ao, MD       haloperidol lactate (HALDOL) injection 5 mg  5 mg Intramuscular TID PRN Albirtha Grinage, Harrold Donath, MD       And   diphenhydrAMINE (BENADRYL) injection 50 mg  50 mg Intramuscular TID PRN Keith Felten, Harrold Donath, MD       And   LORazepam (ATIVAN) injection 2 mg  2 mg Intramuscular TID PRN Keyli Duross, Harrold Donath, MD       haloperidol lactate (HALDOL) injection 10 mg  10 mg Intramuscular TID PRN Bayden Gil, Harrold Donath, MD       And   diphenhydrAMINE (BENADRYL) injection 50 mg  50 mg Intramuscular TID PRN Brettany Sydney, Harrold Donath, MD       And   LORazepam (ATIVAN) injection 2 mg  2 mg Intramuscular TID PRN Phineas Inches, MD       divalproex (DEPAKOTE ER) 24 hr tablet 1,000 mg  1,000 mg Oral QHS Peterson Ao, MD   1,000 mg at 08/07/23 2204   magnesium hydroxide (MILK OF MAGNESIA)  suspension 30 mL  30 mL Oral Daily PRN Peterson Ao, MD       nicotine (NICODERM CQ - dosed in mg/24 hours) patch 14 mg  14 mg Transdermal Daily Peterson Ao, MD   14 mg at 08/07/23 1025   OLANZapine (ZYPREXA) tablet 20 mg  20 mg Oral QHS Peterson Ao, MD   20 mg at 08/07/23 2204   traZODone (DESYREL) tablet 50 mg  50  mg Oral QHS Jearld Lesch, NP   50 mg at 08/07/23 2323    Lab Results:  Results for orders placed or performed during the hospital encounter of 08/05/23 (from the past 48 hours)  Urinalysis, Routine w reflex microscopic -Urine, Clean Catch     Status: Abnormal   Collection Time: 08/06/23  1:39 PM  Result Value Ref Range   Color, Urine STRAW (A) YELLOW   APPearance CLEAR CLEAR   Specific Gravity, Urine 1.012 1.005 - 1.030   pH 7.0 5.0 - 8.0   Glucose, UA NEGATIVE NEGATIVE mg/dL   Hgb urine dipstick NEGATIVE NEGATIVE   Bilirubin Urine NEGATIVE NEGATIVE   Ketones, ur NEGATIVE NEGATIVE mg/dL   Protein, ur NEGATIVE NEGATIVE mg/dL   Nitrite NEGATIVE NEGATIVE   Leukocytes,Ua NEGATIVE NEGATIVE    Comment: Performed at [facility], [address], [place], [place] [address]  Hemoglobin A1c     Status: Abnormal   Collection Time: 08/07/23  6:24 AM  Result Value Ref Range   Hgb A1c MFr Bld 5.7 (H) 4.8 - 5.6 %    Comment: (NOTE) Pre diabetes:          5.7%-6.4%  Diabetes:              >6.4%  Glycemic control for   <7.0% adults with diabetes    Mean Plasma Glucose 116.89 mg/dL    Comment: Performed at [facility], [address], [place], [place] [address]  TSH     Status: None   Collection Time: 08/07/23  6:24 AM  Result Value Ref Range   TSH 3.219 0.350 - 4.500 uIU/mL    Comment: Performed by a 3rd Generation assay with a functional sensitivity of <=0.01 uIU/mL. Performed at [facility], [address], [place], [place] [address]   CK     Status: None   Collection Time: 08/07/23  6:24 AM  Result Value Ref  Range   Total CK 255 49 - 397 U/L    Comment: Performed at [facility], [address], [place], [place] [address]    Blood Alcohol level:  Lab Results  Component Value Date   ETH <10 08/01/2023    Metabolic Disorder Labs: Lab Results  Component Value Date   HGBA1C 5.7 (H) 08/07/2023   MPG 116.89 08/07/2023   MPG 119.76 08/06/2023   No results found for: "PROLACTIN" Lab Results  Component Value Date   CHOL 159 08/06/2023   TRIG 92 08/06/2023   HDL 47 08/06/2023   CHOLHDL 3.4 08/06/2023   VLDL 18 08/06/2023   LDLCALC 94 08/06/2023    Physical Findings: AIMS:  , ,  ,  ,    CIWA:    COWS:     Musculoskeletal: Strength & Muscle Tone: within normal limits Gait & Station: normal Patient leans: N/A  Psychiatric Specialty Exam:  Presentation  General Appearance:  Bizarre; Disheveled  Eye Contact: Fleeting  Speech: Garbled  Speech Volume: Decreased  Handedness: Right   Mood and Affect  Mood: Anxious  Affect: Constricted   Thought Process  Thought Processes: Less disorganized  Descriptions of Associations: More linear, but tangential at times  Orientation:Partial  Thought Content: Confused, at times illogical  History of Schizophrenia/Schizoaffective disorder:Yes  Duration of Psychotic Symptoms:Greater than six months  Hallucinations:Hallucinations: None  Ideas of Reference: Delusions?  Suicidal Thoughts:Suicidal Thoughts: No  Homicidal Thoughts:Homicidal Thoughts: No   Sensorium  Memory: Immediate Poor; Recent Poor; Remote Fair  Judgment: Impaired  Insight: Lacking   Executive Functions  Concentration: Poor  Attention Span: Poor  Recall: Poor  Fund of Knowledge: Poor  Language: Fair   Lexicographer Activity: Psychomotor Activity: Normal   Assets  Assets: Resilience; Social Support   Sleep  Sleep: Sleep: Fair    Physical Exam: Physical Exam Vitals  reviewed.  Constitutional:      General: He is not in acute distress.    Appearance: He is normal weight. He is not toxic-appearing.  Pulmonary:     Effort: Pulmonary effort is normal. No respiratory distress.  Neurological:     Mental Status: He is alert.     Motor: No weakness.     Gait: Gait normal.    Review of Systems  Constitutional:  Negative for chills and fever.  Cardiovascular:  Negative for chest pain and palpitations.  Neurological:  Negative for dizziness, tingling, tremors and headaches.  Psychiatric/Behavioral:  Negative for depression, hallucinations, memory loss, substance abuse and suicidal ideas. The patient is not nervous/anxious and does not have insomnia.        +Delusional  All other systems reviewed and are negative.  Blood pressure 110/79, pulse 90, temperature 97.8 F (36.6 C), temperature source Oral, resp. rate 18, height 5\' 10"  (1.778 m), weight 58.3 kg, SpO2 100%. Body mass index is 18.45 kg/m.   Treatment Plan Summary: Daily contact with patient to assess and evaluate symptoms and progress in treatment and Medication management  Assessment: Principal/active diagnoses: Schizophrenia, paranoid type  Plan: The risks/benefits/side-effects/alternatives to the medications in use were discussed in detail with the patient and time was given for patient's questions. The patient consents to medication trial.     Safety and Monitoring: Involuntary admission to inpatient psychiatric unit for safety, stabilization and treatment Daily contact with patient to assess and evaluate symptoms and progress in treatment Patient's case to be discussed in multi-disciplinary team meeting Observation Level : q15 minute checks Vital signs: q12 hours Precautions: Safety   Medication plan: -Continue Olanzapine 20 mg po for mood control (home medication).  Patient declining any change to his antipsychotic regimen, but can could put him on track for an LAI -Continue  Depakote ER 1,000 mg po Q bedtime for mood stabilization (home med).  1-19 VA level: 72 NH3: 50 - will start lactulose 10g bid  LFTs wnl -Continue Albuterol solution 2.5 mg Q 6 hours prn for SOB.  -Continue Nicotine patch 14 mg trans-dermally Q 24 hours for nicotine withdrawal.   CK has down trended to normal range    Agitation protocol: ---Haldol 5 mg, oral, 3 times daily as needed, mild agitation --Benadryl 50 mg, oral, 3 times daily as needed, mild agitation      OR                                --Haldol injection 5 mg, IM, 3 times daily as needed, moderate agitation --Benadryl injection 50 mg, IM, 3 times daily as needed, moderate agitation --Ativan injection 2 mg, IM, 3 times daily as needed, moderate agitation         OR                             --Haldol injection 5 mg, IM, 3 times daily as needed, severe agitation --Benadryl injection 50 mg, IM, 3 times daily as needed, severe agitation --Ativan  injection 2 mg, IM, 3 times daily as needed, severe agitation   Other PRNS -Continue Tylenol 650 mg every 6 hours PRN for mild pain -Continue Maalox 30 ml Q 4 hrs PRN for indigestion -Continue MOM 30 ml po Q 6 hrs for constipation  Discharge Planning: Social work and case management to assist with discharge planning and identification of hospital follow-up needs prior to discharge Estimated LOS: 3-4 more days if approaching baseline  Discharge Concerns: Need to establish a safety plan; Medication compliance and effectiveness Discharge Goals: Return home with outpatient referrals for mental health follow-up including medication management/psychotherapy   Phineas Inches, MD 08/08/2023, 7:44 AM  Total Time Spent in Direct Patient Care:  I personally spent 34 minutes on the unit in direct patient care. The direct patient care time included face-to-face time with the patient, reviewing the patient's chart, communicating with other professionals, and coordinating care. Greater  than 50% of this time was spent in counseling or coordinating care with the patient regarding goals of hospitalization, psycho-education, and discharge planning needs.   Phineas Inches, MD Psychiatrist

## 2023-08-08 NOTE — Progress Notes (Signed)
   08/08/23 2200  Psych Admission Type (Psych Patients Only)  Admission Status Involuntary  Psychosocial Assessment  Patient Complaints Agitation;Isolation;Suspiciousness  Eye Contact Brief  Facial Expression Worried;Flat;Anxious  Affect Anxious;Preoccupied  Speech Slow  Interaction Cautious;Guarded;Isolative  Motor Activity Other (Comment) (WNL)  Appearance/Hygiene Unremarkable  Behavior Characteristics Guarded  Mood Suspicious  Thought Process  Coherency Circumstantial  Content Delusions  Delusions Paranoid  Perception WDL  Hallucination None reported or observed  Judgment Impaired  Confusion Mild  Danger to Self  Current suicidal ideation? Denies  Description of Suicide Plan None  Agreement Not to Harm Self Yes  Description of Agreement Verbal Contract for safety  Danger to Others  Danger to Others None reported or observed

## 2023-08-08 NOTE — Progress Notes (Signed)
   08/08/23 0800  Psych Admission Type (Psych Patients Only)  Admission Status Involuntary  Psychosocial Assessment  Patient Complaints Disorientation;Hyperactivity  Eye Contact Brief  Facial Expression Anxious;Worried  Affect Anxious;Preoccupied  Teacher, early years/pre Activity Slow  Appearance/Hygiene Unremarkable  Behavior Characteristics Cooperative;Guarded;Calm  Mood Suspicious  Thought Process  Coherency Disorganized  Content Delusions  Delusions Paranoid  Perception Illusions  Hallucination None reported or observed  Judgment Impaired  Confusion Mild  Danger to Self  Current suicidal ideation? Denies  Agreement Not to Harm Self Yes  Description of Agreement Verbal  Danger to Others  Danger to Others None reported or observed

## 2023-08-08 NOTE — Group Note (Signed)
Date:  08/08/2023 Time:  1:18 PM  Group Topic/Focus:   Identifying Unhealthy Thought Patterns:   The focus of this group is to help patients identify their unhealthy thought patterns that have been historically problematic and identify healthy behaviors to address their needs.     Participation Level:  None  Participation Quality:  Inattentive  Affect:  Appropriate  Cognitive:  Appropriate  Insight: None  Engagement in Group:  None  Modes of Intervention:  Education  Additional Comments:    Seidy Labreck D Dago Jungwirth 08/08/2023, 1:18 PM

## 2023-08-08 NOTE — Progress Notes (Signed)
Pt was resting in bed at shift onset.  His affect was constricted but smiled when sharing he has a child on the way.  Pt had to be encouraged to receive meds at the window but he agreed.  Pt was med compliant.  He appeared delusional and stated, "I want to move to room 201.  This isn't my usual room setup."  Staff explained to pt that room is on adolescent unit and he retracted request.  Pt requested sleep medication and provider notified.  Pt was given trazodone and rested in bed thereafter.    08/07/23 2300  Psych Admission Type (Psych Patients Only)  Admission Status Involuntary  Psychosocial Assessment  Patient Complaints Other (Comment) ("I want to move to room 201, this isn't how my room is setup.")  Eye Contact Brief  Facial Expression Worried (smiling)  Affect Constricted;Anxious;Preoccupied  Teacher, early years/pre Activity Slow  Appearance/Hygiene Unremarkable  Behavior Characteristics Guarded;Calm  Mood Other (Comment) (Pt smiled and stated, "I'm happy, I have a kid on the way.  I think it's a girl.  Her name is going to be [place].")  Thought Process  Coherency Disorganized  Content Delusions  Delusions Paranoid (Believes he is supposed to be in room 201)  Perception Illusions  Hallucination None reported or observed  Judgment Impaired  Confusion Mild  Danger to Self  Current suicidal ideation? Denies  Agreement Not to Harm Self Yes  Description of Agreement "I don't think about that."  Danger to Others  Danger to Others None reported or observed

## 2023-08-08 NOTE — Plan of Care (Signed)
  Problem: Education: Goal: Emotional status will improve Outcome: Progressing   Problem: Education: Goal: Emotional status will improve 08/08/2023 0848 by Thersa Salt, RN Outcome: Progressing 08/08/2023 0848 by Thersa Salt, RN Outcome: Progressing Goal: Mental status will improve Outcome: Progressing

## 2023-08-08 NOTE — Plan of Care (Signed)
  Problem: Education: Goal: Knowledge of [place] [facility] Education information/materials will improve Outcome: Not Progressing Goal: Emotional status will improve Outcome: Not Progressing Goal: Mental status will improve Outcome: Not Progressing Goal: Verbalization of understanding the information provided will improve Outcome: Not Progressing   Problem: Activity: Goal: Interest or engagement in activities will improve Outcome: Not Progressing

## 2023-08-08 NOTE — Plan of Care (Signed)
  Problem: Safety: Goal: Periods of time without injury will increase Outcome: Progressing   Problem: Education: Goal: Emotional status will improve Outcome: Progressing Goal: Mental status will improve Outcome: Progressing

## 2023-08-08 NOTE — [facility] Group Notes (Signed)
[facility] Group Notes:  (Nursing/MHT/Case Management/Adjunct)  Date:  08/08/2023  Time:  2000  Type of Therapy:   Wrap up group  Participation Level:  None  Participation Quality:  Resistant  Affect:  {[facility] AFFECT:22266}  Cognitive:  {[facility] COGNITIVE:22267}  Insight:  {B[facility] Insight2:20797}  Engagement in Group:  {[facility] ENGAGEMENT IN RJJOA:41660}  Modes of Intervention:  {[facility] MODES OF INTERVENTION:22269}  Summary of Progress/Problems:  Cesar Robinson 08/08/2023, 11:03 PM

## 2023-08-09 DIAGNOSIS — F200 Paranoid schizophrenia: Secondary | ICD-10-CM | POA: Diagnosis not present

## 2023-08-09 NOTE — Group Note (Signed)
Recreation Therapy Group Note   Group Topic:Communication  Group Date: 08/09/2023 Start Time: 0933 End Time: 0955 Facilitators: Kvon Mcilhenny-McCall, LRT,CTRS Location: [address] Dayroom   Group Topic: Communication, Problem Solving   Goal Area(s) Addresses:  Patient will effectively listen to complete activity.  Patient will identify communication skills used to make activity successful.  Patient will identify how skills used during activity can be used to reach post d/c goals.    Intervention: Building surveyor Activity - Geometric pattern cards, pencils, blank paper    Activity: Geometric Drawings.  Three volunteers from the peer group will be shown an abstract picture with a particular arrangement of geometrical shapes.  Each round, one 'speaker' will describe the pattern, as accurately as possible without revealing the image to the group.  The remaining group members will listen and draw the picture to reflect how it is described to them. Patients with the role of 'listener' cannot ask clarifying questions but, may request that the speaker repeat a direction. Once the drawings are complete, the presenter will show the rest of the group the picture and compare how close each person came to drawing the picture. LRT will facilitate a post-activity discussion regarding effective communication and the importance of planning, listening, and asking for clarification in daily interactions with others.  Education: Environmental consultant, Active listening, Support systems, Discharge planning  Education Outcome: Acknowledges understanding/In group clarification offered/Needs additional education.    Affect/Mood: N/A   Participation Level: Did not attend    Clinical Observations/Individualized Feedback:     Plan: Continue to engage patient in RT group sessions 2-3x/week.   Jilliam Bellmore-McCall, LRT,CTRS 08/09/2023 11:51 AM

## 2023-08-09 NOTE — Progress Notes (Signed)
Patient has on disposable EKG Pads from ED and declines to take them off. He becomes paranoid and agitated when asked to remove them. Patient states "it's not bothering anyone".  Patient educated/redirected. Writer asked if staff could remove before his next shower.

## 2023-08-09 NOTE — Progress Notes (Signed)
[facility] MD Progress Note  08/09/2023 12:27 PM Cesar Robinson  MRN:  [id number]  Subjective:  The patient is a 43 year old male with a history of schizophrenia presents to the emergency department escorted by [employer]. Much of the history is provided by police secondary to acute psychotic episode. Per [employer], patient's brother notes noncompliance with psychiatric medications for an unknown length of time. The patient left his home today and was found standing outside in the lawn of an unknown home. Police tried to transport the patient back to his residence where the patient did not recognize his twin brother and said that his brother tried to kill his mother. The patient has been exceedingly paranoid with police. Not making any suicidal or homicidal remarks. Challenging to redirect, but nonviolent.  Yesterday the psychiatry team made the following recommendations: -Continue Olanzapine 20 mg po for mood control (home medication).  -Continue Depakote ER 1,000 mg po Q bedtime for mood stabilization (home med).   Per nursing report - pt is paranoid, confused off and on, trying to get out of the unit emergency exit door last night again, combative at times.   On assessment today, patient continues to be confused off and on.  He is A and O x 2.  Per staff he continues to be paranoid, and was combative last night.  Patient does not recall this.  It is still unclear if the patient has returned to a psychiatric baseline or not.  He continues to decline consent for me to contact his mother.  He is unsure where he would live at discharge.  Reports that sleep is okay.  Reports appetite is okay.  Concentration is poor.  Reports mood is anxious.  Denies any SI or HI.  Denies any AH or VH.  Does not appear to be responding to internal stimuli.  Does not overtly paranoid on my exam.  Denies having side effects to current psychiatric medications.    Is going to be difficult to determine, without the patient's consent to contact his  mother, if and when he returns to his psychiatric baseline.     Principal Problem: Schizophrenia (HCC) Diagnosis: Principal Problem:   Schizophrenia (HCC)  Total Time spent with patient: 20 minutes  Past Psychiatric History:  Per H&P, patient previously diagnosed with schizophrenia and was taking Zyprexa and Depakote as outpatient.  He has 1 previous hospitalization but has been apparently stable for about 10 years.  Past Medical History:  Past Medical History:  Diagnosis Date   Schizophrenia (HCC)    Sleep apnea    History reviewed. No pertinent surgical history. Family History: History reviewed. No pertinent family history. Family Psychiatric  History: See H&P Social History:  Social History   Substance and Sexual Activity  Alcohol Use Not Currently   Comment: occ     Social History   Substance and Sexual Activity  Drug Use Yes   Types: Marijuana   Comment: Uses "a couple weekends"    Social History   Socioeconomic History   Marital status: Single    Spouse name: Not on file   Number of children: Not on file   Years of education: Not on file   Highest education level: Not on file  Occupational History   Occupation: unemployed  Tobacco Use   Smoking status: Every Day    Current packs/day: 0.25    Types: Cigarettes   Smokeless tobacco: Not on file   Tobacco comments:    Black and Mild, declines cessation  Vaping Use   Vaping status: Never Used  Substance and Sexual Activity   Alcohol use: Not Currently    Comment: occ   Drug use: Yes    Types: Marijuana    Comment: Uses "a couple weekends"   Sexual activity: Yes  Other Topics Concern   Not on file  Social History Narrative   Not on file   Social Drivers of Health   Financial Resource Strain: Not on file  Food Insecurity: Food Insecurity Present (08/05/2023)   Hunger Vital Sign    Worried About Running Out of Food in the Last Year: Patient unable to answer    Ran Out of Food in the Last Year:  Sometimes true  Transportation Needs: Patient Unable To Answer (08/05/2023)   PRAPARE - Administrator, Civil Service (Medical): Patient unable to answer    Lack of Transportation (Non-Medical): Patient unable to answer  Physical Activity: Not on file  Stress: Not on file  Social Connections: Not on file   Additional Social History:                          Current Medications: Current Facility-Administered Medications  Medication Dose Route Frequency Provider Last Rate Last Admin   acetaminophen (TYLENOL) tablet 650 mg  650 mg Oral Q6H PRN Peterson Ao, MD       albuterol (PROVENTIL) (2.5 MG/3ML) 0.083% nebulizer solution 2.5 mg  2.5 mg Nebulization Q6H PRN Peterson Ao, MD       alum & mag hydroxide-simeth (MAALOX/MYLANTA) 200-200-20 MG/5ML suspension 30 mL  30 mL Oral Q4H PRN Peterson Ao, MD       haloperidol (HALDOL) tablet 5 mg  5 mg Oral TID PRN Peterson Ao, MD       And   diphenhydrAMINE (BENADRYL) capsule 50 mg  50 mg Oral TID PRN Peterson Ao, MD       haloperidol lactate (HALDOL) injection 5 mg  5 mg Intramuscular TID PRN Daimian Sudberry, Harrold Donath, MD       And   diphenhydrAMINE (BENADRYL) injection 50 mg  50 mg Intramuscular TID PRN Lisaann Atha, Harrold Donath, MD       And   LORazepam (ATIVAN) injection 2 mg  2 mg Intramuscular TID PRN Tennessee Hanlon, Harrold Donath, MD       haloperidol lactate (HALDOL) injection 10 mg  10 mg Intramuscular TID PRN Olly Shiner, Harrold Donath, MD       And   diphenhydrAMINE (BENADRYL) injection 50 mg  50 mg Intramuscular TID PRN Montrel Donahoe, Harrold Donath, MD       And   LORazepam (ATIVAN) injection 2 mg  2 mg Intramuscular TID PRN Phineas Inches, MD       divalproex (DEPAKOTE ER) 24 hr tablet 1,000 mg  1,000 mg Oral QHS Peterson Ao, MD   1,000 mg at 08/08/23 2115   lactulose (CHRONULAC) 10 GM/15ML solution 10 g  10 g Oral BID Doris Mcgilvery, Harrold Donath, MD   10 g at 08/09/23 0915   magnesium hydroxide (MILK OF MAGNESIA) suspension 30 mL  30 mL  Oral Daily PRN Peterson Ao, MD       nicotine (NICODERM CQ - dosed in mg/24 hours) patch 14 mg  14 mg Transdermal Daily Peterson Ao, MD   14 mg at 08/08/23 0908   OLANZapine (ZYPREXA) tablet 20 mg  20 mg Oral QHS Peterson Ao, MD   20 mg at 08/08/23 2114   traZODone (DESYREL) tablet 50 mg  50 mg Oral QHS  Jearld Lesch, NP   50 mg at 08/08/23 2114    Lab Results:  Results for orders placed or performed during the hospital encounter of 08/05/23 (from the past 48 hours)  Valproic acid level     Status: None   Collection Time: 08/08/23  6:35 AM  Result Value Ref Range   Valproic Acid Lvl 72 50.0 - 100.0 ug/mL    Comment: Performed at [facility], [address] [place], [place] [address]  Ammonia     Status: Abnormal   Collection Time: 08/08/23  6:35 AM  Result Value Ref Range   Ammonia 50 (H) 9 - 35 umol/L    Comment: Performed at [facility], [address], [place], [place] [address]  Hepatic function panel     Status: None   Collection Time: 08/08/23  6:35 AM  Result Value Ref Range   Total Protein 6.8 6.5 - 8.1 g/dL   Albumin 3.6 3.5 - 5.0 g/dL   AST 17 15 - 41 U/L   ALT 16 0 - 44 U/L   Alkaline Phosphatase 45 38 - 126 U/L   Total Bilirubin 0.9 0.0 - 1.2 mg/dL   Bilirubin, Direct 0.2 0.0 - 0.2 mg/dL   Indirect Bilirubin 0.7 0.3 - 0.9 mg/dL    Comment: Performed at [facility], [address], [place], [place] [address]    Blood Alcohol level:  Lab Results  Component Value Date   ETH <10 08/01/2023    Metabolic Disorder Labs: Lab Results  Component Value Date   HGBA1C 5.7 (H) 08/07/2023   MPG 116.89 08/07/2023   MPG 119.76 08/06/2023   No results found for: "PROLACTIN" Lab Results  Component Value Date   CHOL 159 08/06/2023   TRIG 92 08/06/2023   HDL 47 08/06/2023   CHOLHDL 3.4 08/06/2023   VLDL 18 08/06/2023   LDLCALC 94 08/06/2023    Physical Findings: AIMS:  , ,  ,  ,    CIWA:     COWS:     Musculoskeletal: Strength & Muscle Tone: within normal limits Gait & Station: normal Patient leans: N/A  Psychiatric Specialty Exam:  Presentation  General Appearance:  Disheveled  Eye Contact: Fair  Speech: Slow  Speech Volume: Decreased  Handedness: Right   Mood and Affect  Mood: Anxious  Affect: Full Range   Thought Process  Thought Processes: Less disorganized  Descriptions of Associations: More linear, but tangential at times  Orientation:Partial  Thought Content: Confused, at times illogical  History of Schizophrenia/Schizoaffective disorder:Yes  Duration of Psychotic Symptoms:Greater than six months  Hallucinations:Hallucinations: None  Ideas of Reference: Delusions?  Suicidal Thoughts:Suicidal Thoughts: No  Homicidal Thoughts:Homicidal Thoughts: No   Sensorium  Memory: Immediate Poor; Recent Poor; Remote Fair  Judgment: Impaired  Insight: Lacking   Executive Functions  Concentration: Poor  Attention Span: Poor  Recall: Poor  Fund of Knowledge: Poor  Language: Fair   Psychomotor Activity  Psychomotor Activity: Psychomotor Activity: Normal   Assets  Assets: Resilience; Social Support   Sleep  Sleep: Sleep: Fair    Physical Exam: Physical Exam Vitals reviewed.  Constitutional:      General: He is not in acute distress.    Appearance: He is normal weight. He is not toxic-appearing.  Pulmonary:     Effort: Pulmonary effort is normal. No respiratory distress.  Neurological:     Mental Status: He is alert.     Motor: No weakness.  Gait: Gait normal.    Review of Systems  Constitutional:  Negative for chills and fever.  Cardiovascular:  Negative for chest pain and palpitations.  Neurological:  Negative for dizziness, tingling, tremors and headaches.  Psychiatric/Behavioral:  Negative for depression, hallucinations, memory loss, substance abuse and suicidal ideas. The patient is not  nervous/anxious and does not have insomnia.        +Delusional  All other systems reviewed and are negative.  Blood pressure 113/87, pulse 94, temperature 97.6 F (36.4 C), temperature source Oral, resp. rate 18, height 5\' 10"  (1.778 m), weight 58.3 kg, SpO2 100%. Body mass index is 18.45 kg/m.   Treatment Plan Summary: Daily contact with patient to assess and evaluate symptoms and progress in treatment and Medication management  Assessment: Principal/active diagnoses: Schizophrenia, paranoid type  Plan: The risks/benefits/side-effects/alternatives to the medications in use were discussed in detail with the patient and time was given for patient's questions. The patient consents to medication trial.     Safety and Monitoring: Involuntary admission to inpatient psychiatric unit for safety, stabilization and treatment Daily contact with patient to assess and evaluate symptoms and progress in treatment Patient's case to be discussed in multi-disciplinary team meeting Observation Level : q15 minute checks Vital signs: q12 hours Precautions: Safety   Medication plan: -Continue Olanzapine 20 mg po for mood control (home medication).  Patient declining any change to his antipsychotic regimen, but can could put him on track for an LAI  -Continue Depakote ER 1,000 mg po Q bedtime for mood stabilization (home med).  1-19 [facility] level: 72 NH3: 50 -continue lactulose 10g bid  LFTs wnl  -Continue Albuterol solution 2.5 mg Q 6 hours prn for SOB.  -Continue Nicotine patch 14 mg trans-dermally Q 24 hours for nicotine withdrawal.   CK has down trended to normal range    Agitation protocol: ---Haldol 5 mg, oral, 3 times daily as needed, mild agitation --Benadryl 50 mg, oral, 3 times daily as needed, mild agitation      OR                                --Haldol injection 5 mg, IM, 3 times daily as needed, moderate agitation --Benadryl injection 50 mg, IM, 3 times daily as needed, moderate  agitation --Ativan injection 2 mg, IM, 3 times daily as needed, moderate agitation         OR                             --Haldol injection 5 mg, IM, 3 times daily as needed, severe agitation --Benadryl injection 50 mg, IM, 3 times daily as needed, severe agitation --Ativan injection 2 mg, IM, 3 times daily as needed, severe agitation   Other PRNS -Continue Tylenol 650 mg every 6 hours PRN for mild pain -Continue Maalox 30 ml Q 4 hrs PRN for indigestion -Continue MOM 30 ml po Q 6 hrs for constipation  Discharge Planning: Social work and case management to assist with discharge planning and identification of hospital follow-up needs prior to discharge Estimated LOS: 3-4 more days if approaching baseline  Discharge Concerns: Need to establish a safety plan; Medication compliance and effectiveness Discharge Goals: Return home with outpatient referrals for mental health follow-up including medication management/psychotherapy   Phineas Inches, MD 08/09/2023, 12:27 PM  Total Time Spent in Direct Patient Care:  I personally spent  25 minutes on the unit in direct patient care. The direct patient care time included face-to-face time with the patient, reviewing the patient's chart, communicating with other professionals, and coordinating care. Greater than 50% of this time was spent in counseling or coordinating care with the patient regarding goals of hospitalization, psycho-education, and discharge planning needs.   Phineas Inches, MD Psychiatrist

## 2023-08-09 NOTE — Progress Notes (Signed)
   08/09/23 0700  Psych Admission Type (Psych Patients Only)  Admission Status Involuntary  Psychosocial Assessment  Patient Complaints Anxiety;Depression;Suspiciousness  Eye Contact Brief  Facial Expression Flat;Anxious  Affect Preoccupied  Speech Unremarkable  Interaction Cautious;Guarded  Motor Activity Other (Comment) (WNL)  Appearance/Hygiene Unremarkable  Behavior Characteristics Calm;Guarded  Mood Suspicious  Thought Process  Coherency Circumstantial  Content Delusions  Delusions Paranoid  Perception WDL  Hallucination None reported or observed  Judgment Impaired  Confusion None  Danger to Self  Current suicidal ideation? Denies  Agreement Not to Harm Self Yes  Description of Agreement Verbal  Danger to Others  Danger to Others None reported or observed

## 2023-08-09 NOTE — Plan of Care (Signed)
  Problem: Activity: Goal: Interest or engagement in activities will improve Outcome: Progressing Goal: Sleeping patterns will improve Outcome: Progressing   

## 2023-08-10 DIAGNOSIS — F200 Paranoid schizophrenia: Secondary | ICD-10-CM | POA: Diagnosis not present

## 2023-08-10 LAB — HEPATIC FUNCTION PANEL
ALT: 9 U/L (ref 0–44)
AST: 23 U/L (ref 15–41)
Albumin: 4 g/dL (ref 3.5–5.0)
Alkaline Phosphatase: 51 U/L (ref 38–126)
Bilirubin, Direct: 0.2 mg/dL (ref 0.0–0.2)
Indirect Bilirubin: 0.6 mg/dL (ref 0.3–0.9)
Total Bilirubin: 0.8 mg/dL (ref 0.0–1.2)
Total Protein: 7.4 g/dL (ref 6.5–8.1)

## 2023-08-10 LAB — AMMONIA: Ammonia: 34 umol/L (ref 9–35)

## 2023-08-10 NOTE — Progress Notes (Signed)
Pt was resting in bed at shift onset.  Pt was polite and calm.  Pt continues to isolate to room and was unwilling to participate in group."  Pt denied SI/HI/AVH and stated r/t mood, "I'm cool, just sleepy.  I like to spend time on my own.  Some anxiety and preoccupation was apparent.  Malodor noted and pt agreed to take a shower; provided clothing.  Pt took 1700 Lactulose at 2120 after permission from provider was granted to give late.  Pt reported he had only had 1 BM since admission.     08/09/23 2130  Psych Admission Type (Psych Patients Only)  Admission Status Involuntary  Psychosocial Assessment  Patient Complaints Anxiety  Eye Contact Brief  Facial Expression Anxious;Flat  Affect Flat  Speech Other (Comment) (Polite)  Interaction Isolative;No initiation  Appearance/Hygiene Poor hygiene (pt reported intention to shower and was provided clothing)  Behavior Characteristics Calm;Unwilling to participate  Mood Ambivalent  Thought Process  Coherency Loose associations  Content Preoccupation  Delusions Paranoid  Perception WDL  Hallucination None reported or observed  Judgment Impaired  Confusion None  Danger to Self  Current suicidal ideation? Denies  Agreement Not to Harm Self Yes  Description of Agreement Verbal  Danger to Others  Danger to Others None reported or observed

## 2023-08-10 NOTE — Progress Notes (Signed)
Patient refused 1700 dose of Lactulose. NP notified via secure chat. Safety maintained.

## 2023-08-10 NOTE — Plan of Care (Signed)
  Problem: Coping: Goal: Ability to demonstrate self-control will improve Outcome: Progressing   Problem: Health Behavior/Discharge Planning: Goal: Compliance with treatment plan for underlying cause of condition will improve Outcome: Progressing   Problem: Activity: Goal: Interest or engagement in activities will improve Outcome: Not Progressing

## 2023-08-10 NOTE — Progress Notes (Signed)
   08/10/23 0820  Psych Admission Type (Psych Patients Only)  Admission Status Involuntary  Psychosocial Assessment  Patient Complaints None  Eye Contact Brief  Facial Expression Anxious  Affect Anxious  Speech Logical/coherent  Interaction Isolative;No initiation  Motor Activity Slow  Appearance/Hygiene Poor hygiene  Behavior Characteristics Unwilling to participate;Calm  Mood Pleasant;Anxious  Thought Process  Coherency Disorganized  Content Preoccupation  Delusions Paranoid  Perception WDL  Hallucination None reported or observed  Judgment Poor  Confusion None  Danger to Self  Current suicidal ideation? Denies  Agreement Not to Harm Self Yes  Description of Agreement Verbal  Danger to Others  Danger to Others None reported or observed
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
## 2023-08-10 NOTE — [facility] Group Notes (Signed)
[facility] Group Notes:  (Nursing/MHT/Case Management/Adjunct)  Date:  08/10/2023  Time:  9:24 PM  Type of Therapy:   Wrap-up group  Participation Level:  Did Not Attend  Participation Quality:    Affect:    Cognitive:    Insight:    Engagement in Group:   Modes of Intervention:    Summary of Progress/Problems: Pt refused to attend group. Writer provided [name] with a list of coping skills worksheet.   Cesar Robinson 08/10/2023, 9:24 PM

## 2023-08-10 NOTE — Plan of Care (Signed)
  Problem: Education: Goal: Emotional status will improve Outcome: Progressing   Problem: Activity: Goal: Sleeping patterns will improve Outcome: Progressing   Problem: Physical Regulation: Goal: Ability to maintain clinical measurements within normal limits will improve Outcome: Progressing   Problem: Safety: Goal: Periods of time without injury will increase Outcome: Progressing

## 2023-08-10 NOTE — Group Note (Signed)
Recreation Therapy Group Note   Group Topic:Animal Assisted Therapy   Group Date: 08/10/2023 Start Time: 1610 End Time: 1030 Facilitators: Soraida Vickers-McCall, LRT,CTRS Location: [address] Dayroom   Animal-Assisted Activity (AAA) Program Checklist/Progress Notes Patient Eligibility Criteria Checklist & Daily Group note for Rec Tx Intervention  AAA/T Program Assumption of Risk Form signed by Patient/ or Parent Legal Guardian Yes  Patient understands his/her participation is voluntary Yes  Education: Charity fundraiser, Appropriate Animal Interaction   Education Outcome: Acknowledges education.     Affect/Mood: N/A   Participation Level: Did not attend    Clinical Observations/Individualized Feedback:     Plan: Continue to engage patient in RT group sessions 2-3x/week.   Kimball Appleby-McCall, LRT,CTRS 08/10/2023 12:17 PM

## 2023-08-10 NOTE — Group Note (Signed)
LCSW Group Therapy Note   Group Date: 08/10/2023 Start Time: 1100 End Time: 1200   Type of Therapy and Topic:  Group Therapy  Topic:  Shining from Within:  Confidence and Self-Love Journey  Participation:  did not attend  Objective: Promote Self-Awareness and Realistic Self-Talk: Help participants recognize their strengths and replace negative thoughts with truthful, realistic statements to build confidence.  Goals: Increase Confidence: Help participants develop a positive self-image by focusing on their strengths and personal progress. Set Achievable Goals: Guide participants in creating small, realistic goals that foster a sense of accomplishment and build momentum. Enhance Self-Care Practices: Encourage participants to incorporate self-care activities into their routine to support emotional well-being and reinforce confidence.  Summary: The "Shining from Within: Confidence and Self-Love Journey" group helped individuals build stronger self-confidence through truthful, realistic self-talk, goal-setting, and self-care practices. Participants recognized their unique strengths, set achievable goals, and nurtured a supportive environment for mutual growth. The group provided practical tools to foster lasting confidence and self-belief, empowering each member to shine from within and embrace their personal power with greater self-assurance.  Therapeutic Modalities: Elements of CBT Elements of DBT   Alla Feeling, LCSWA 08/10/2023  5:51 PM

## 2023-08-11 DIAGNOSIS — F200 Paranoid schizophrenia: Secondary | ICD-10-CM | POA: Diagnosis not present

## 2023-08-11 MED ORDER — BENZTROPINE MESYLATE 0.5 MG PO TABS
0.5000 mg | ORAL_TABLET | Freq: Two times a day (BID) | ORAL | Status: DC
  Administered 2023-08-11 – 2023-08-17 (×13): 0.5 mg via ORAL
  Filled 2023-08-11 (×21): qty 1

## 2023-08-11 MED ORDER — HALOPERIDOL 5 MG PO TABS
5.0000 mg | ORAL_TABLET | Freq: Two times a day (BID) | ORAL | Status: DC
  Administered 2023-08-11 – 2023-08-13 (×5): 5 mg via ORAL
  Filled 2023-08-11 (×12): qty 1

## 2023-08-11 NOTE — Progress Notes (Addendum)
[facility] MD Progress Note  08/11/2023 2:44 PM HADRIAN LEGATES  MRN:  [id number]  HPI: Per chart review:  The patient is a 43 year old male with a history of schizophrenia presents to the emergency department escorted by [employer]. Much of the history is provided by police secondary to acute psychotic episode. Per [employer], patient's brother notes noncompliance with psychiatric medications for an unknown length of time. The patient left his home today and was found standing outside in the lawn of an unknown home. Police tried to transport the patient back to his residence where the patient did not recognize his twin brother and said that his brother tried to kill his mother. The patient has been exceedingly paranoid with police. Not making any suicidal or homicidal remarks. Challenging to redirect, but nonviolent.   Chart Review from last 24 hours:  The patient's chart and nursing notes were reviewed. The patient's case was discussed in multidisciplinary team meeting. Vital signs within defined limits. Patient is compliant with routine medication regimen without difficulty. Sleep hours last night: 7.5 hours, as documented in the nursing flow sheets. Nursing reports concerns about worsening irritation and confusion, with the patient refusing to perform ADLs. No behavioral episodes reported at this time. No PRN medication was required, according to the nursing record   Patient assessment 1/22:  The patient was seen face-to-face by this provider and case staffed with attending psychiatrist, N. Abbott Pao.  During today's encounter, the patient was observed lying in bed asleep. On assessment today, the patient is alert and oriented to person, place, and time but disoriented to situation. He denies experiencing auditory or visual hallucinations, delusions, or paranoia. The patient also denies suicidal or homicidal ideation. He rates his anxiety as 8/10 and his depression as 0/10, with 10 being the most severe. He is unable to  identify specific triggers for his anxiety. No medication side effects are reported.  Throughout the assessment, the patient kept his eyes closed for the majority of the time and described his mood as "sleepy."  Sleep and appetite are reported as satisfactory, but he appeared preoccupied during the session.  The plan is to discontinue olanzapine as the patient is already on the maximum dosage. Haldol 5 mg twice daily will be initiated to target psychotic symptoms. Collateral information was obtained from the patient's mother on January 21 to help determine his baseline (see below).   No signs of TD (Tardive Dyskinesia) or EPS (Extrapyramidal Symptoms) were observed during the assessment, and the patient reports no feelings of stiffness. AIMS score: 0. Cogentin 0.5 mg twice daily will be initiated for prevention.   Continued hospitalization remains necessary at this time, to treat and stabilize mental status prior to discharge.    We discussed changes to the current medication regimen: -Discontinue olanzapine 20 mg po for mood control (home medication).   -Start Haldol 5 mg twice daily to target psychotic symptoms. -Start Cogentin 0.5 mg twice daily for EPS prevention.   -------------------------------------------------------------------------------------- Collateral information: Patient's mother Brendia Sacks called at [phone] to check if patient is at baseline with regard to his mental status.  This is mainly obtaining information from patient's mom without providing information to patient's mom (as patient is IVC) and refusing consent to call mom.  Patient's mom report that patient lives with her in her home and is compliant with medications.  However, when patient goes to visit with his twin brother in his apartment, she does not know what transpires with them.  She also noted patient starting  to be confused 3 months ago and appears withdrawn and would not communicate with her  effectively.  She appreciates being called and thanked this provider for the care that we provide for his son.  Mother also added that upon discharge, patient is returning to her home. --------------------------------------------------------------------------------------   Principal Problem: Schizophrenia (HCC) Diagnosis: Principal Problem:   Schizophrenia (HCC)  Total Time spent with patient: 45 minutes  Past Psychiatric History:  Per H&P, patient previously diagnosed with schizophrenia and was taking Zyprexa and Depakote as outpatient.  He has 1 previous hospitalization but has been apparently stable for about 10 years.  Past Medical History:  Past Medical History:  Diagnosis Date   Schizophrenia (HCC)    Sleep apnea    History reviewed. No pertinent surgical history. Family History: History reviewed. No pertinent family history. Family Psychiatric  History: See H&P Social History:  Social History   Substance and Sexual Activity  Alcohol Use Not Currently   Comment: occ     Social History   Substance and Sexual Activity  Drug Use Yes   Types: Marijuana   Comment: Uses "a couple weekends"    Social History   Socioeconomic History   Marital status: Single    Spouse name: Not on file   Number of children: Not on file   Years of education: Not on file   Highest education level: Not on file  Occupational History   Occupation: unemployed  Tobacco Use   Smoking status: Every Day    Current packs/day: 0.25    Types: Cigarettes   Smokeless tobacco: Not on file   Tobacco comments:    Black and Mild, declines cessation  Vaping Use   Vaping status: Never Used  Substance and Sexual Activity   Alcohol use: Not Currently    Comment: occ   Drug use: Yes    Types: Marijuana    Comment: Uses "a couple weekends"   Sexual activity: Yes  Other Topics Concern   Not on file  Social History Narrative   Not on file   Social Drivers of Health   Financial Resource Strain:  Not on file  Food Insecurity: Food Insecurity Present (08/05/2023)   Hunger Vital Sign    Worried About Running Out of Food in the Last Year: Patient unable to answer    Ran Out of Food in the Last Year: Sometimes true  Transportation Needs: Patient Unable To Answer (08/05/2023)   PRAPARE - Administrator, Civil Service (Medical): Patient unable to answer    Lack of Transportation (Non-Medical): Patient unable to answer  Physical Activity: Not on file  Stress: Not on file  Social Connections: Not on file   Additional Social History:    Current Medications: Current Facility-Administered Medications  Medication Dose Route Frequency Provider Last Rate Last Admin   acetaminophen (TYLENOL) tablet 650 mg  650 mg Oral Q6H PRN Peterson Ao, MD       albuterol (PROVENTIL) (2.5 MG/3ML) 0.083% nebulizer solution 2.5 mg  2.5 mg Nebulization Q6H PRN Peterson Ao, MD       alum & mag hydroxide-simeth (MAALOX/MYLANTA) 200-200-20 MG/5ML suspension 30 mL  30 mL Oral Q4H PRN Peterson Ao, MD       benztropine (COGENTIN) tablet 0.5 mg  0.5 mg Oral BID Jahdiel Krol H, NP   0.5 mg at 08/11/23 1400   haloperidol (HALDOL) tablet 5 mg  5 mg Oral TID PRN Peterson Ao, MD       And  diphenhydrAMINE (BENADRYL) capsule 50 mg  50 mg Oral TID PRN Peterson Ao, MD       haloperidol lactate (HALDOL) injection 5 mg  5 mg Intramuscular TID PRN Massengill, Harrold Donath, MD       And   diphenhydrAMINE (BENADRYL) injection 50 mg  50 mg Intramuscular TID PRN Massengill, Harrold Donath, MD       And   LORazepam (ATIVAN) injection 2 mg  2 mg Intramuscular TID PRN Massengill, Harrold Donath, MD       haloperidol lactate (HALDOL) injection 10 mg  10 mg Intramuscular TID PRN Massengill, Harrold Donath, MD       And   diphenhydrAMINE (BENADRYL) injection 50 mg  50 mg Intramuscular TID PRN Massengill, Harrold Donath, MD       And   LORazepam (ATIVAN) injection 2 mg  2 mg Intramuscular TID PRN Phineas Inches, MD       divalproex  (DEPAKOTE ER) 24 hr tablet 1,000 mg  1,000 mg Oral QHS Peterson Ao, MD   1,000 mg at 08/10/23 2202   haloperidol (HALDOL) tablet 5 mg  5 mg Oral BID Lacy Sofia H, NP   5 mg at 08/11/23 1400   lactulose (CHRONULAC) 10 GM/15ML solution 10 g  10 g Oral BID Massengill, Harrold Donath, MD   10 g at 08/10/23 0900   magnesium hydroxide (MILK OF MAGNESIA) suspension 30 mL  30 mL Oral Daily PRN Peterson Ao, MD       nicotine (NICODERM CQ - dosed in mg/24 hours) patch 14 mg  14 mg Transdermal Daily Peterson Ao, MD   14 mg at 08/10/23 0901   traZODone (DESYREL) tablet 50 mg  50 mg Oral QHS Jearld Lesch, NP   50 mg at 08/10/23 2202   Lab Results:  Results for orders placed or performed during the hospital encounter of 08/05/23 (from the past 48 hours)  Hepatic function panel     Status: None   Collection Time: 08/10/23  6:30 PM  Result Value Ref Range   Total Protein 7.4 6.5 - 8.1 g/dL   Albumin 4.0 3.5 - 5.0 g/dL   AST 23 15 - 41 U/L   ALT 9 0 - 44 U/L   Alkaline Phosphatase 51 38 - 126 U/L   Total Bilirubin 0.8 0.0 - 1.2 mg/dL   Bilirubin, Direct 0.2 0.0 - 0.2 mg/dL   Indirect Bilirubin 0.6 0.3 - 0.9 mg/dL    Comment: Performed at [facility], [address], [place], [place] [address]  Ammonia     Status: None   Collection Time: 08/10/23  6:30 PM  Result Value Ref Range   Ammonia 34 9 - 35 umol/L    Comment: HEMOLYSIS AT THIS LEVEL MAY AFFECT RESULT Performed at [facility], [address], [place], [place] [address]     Blood Alcohol level:  Lab Results  Component Value Date   ETH <10 08/01/2023    Metabolic Disorder Labs: Lab Results  Component Value Date   HGBA1C 5.7 (H) 08/07/2023   MPG 116.89 08/07/2023   MPG 119.76 08/06/2023   No results found for: "PROLACTIN" Lab Results  Component Value Date   CHOL 159 08/06/2023   TRIG 92 08/06/2023   HDL 47 08/06/2023   CHOLHDL 3.4 08/06/2023   VLDL 18 08/06/2023   LDLCALC 94  08/06/2023    Physical Findings: AIMS: Facial and Oral Movements Muscles of Facial Expression: None Lips and Perioral Area: None Jaw: None Tongue: None,Extremity Movements Upper (  arms, wrists, hands, fingers): None Lower (legs, knees, ankles, toes): None, Trunk Movements Neck, shoulders, hips: None, Global Judgements Severity of abnormal movements overall : None Incapacitation due to abnormal movements: None Patient's awareness of abnormal movements: No Awareness, Dental Status Current problems with teeth and/or dentures?: No Does patient usually wear dentures?: No Edentia?: No  CIWA:    COWS:     Musculoskeletal: Strength & Muscle Tone: within normal limits Gait & Station: normal Patient leans: N/A  Psychiatric Specialty Exam:  Presentation  General Appearance:  Bizarre  Eye Contact: Minimal  Speech: Clear and Coherent  Speech Volume: Normal  Handedness: Right  Mood and Affect  Mood: Anxious; Dysphoric  Affect: Flat; Congruent  Thought Process  Thought Processes: Less disorganized  Descriptions of Associations: More linear, but tangential at times  Orientation:Full (Time, Place and Person) (Disoriented to situation)  Thought Content: Confused, at times illogical  History of Schizophrenia/Schizoaffective disorder:Yes  Duration of Psychotic Symptoms:Greater than six months  Hallucinations:Hallucinations: None; Other (comment) (Denies)  Ideas of Reference: Delusions?  Suicidal Thoughts:Suicidal Thoughts: No  Homicidal Thoughts:Homicidal Thoughts: No  Sensorium  Memory: Immediate Fair; Recent Fair  Judgment: Impaired  Insight: Lacking  Executive Functions  Concentration: Poor  Attention Span: Fair  Recall: Fair  Fund of Knowledge: Poor  Language: Fair  Psychomotor Activity  Psychomotor Activity: Psychomotor Activity: Normal  Assets  Assets: Housing; Social Support  Sleep  Sleep: Sleep: Good Number of Hours of  Sleep: 9.5  Physical Exam: Physical Exam Vitals and nursing note reviewed.  Constitutional:      General: He is not in acute distress.    Appearance: He is normal weight. He is not toxic-appearing.  HENT:     Head: Normocephalic.     Nose: Nose normal.     Mouth/Throat:     Pharynx: Oropharynx is clear.  Eyes:     Conjunctiva/sclera: Conjunctivae normal.  Cardiovascular:     Pulses: Normal pulses.  Pulmonary:     Effort: No respiratory distress.  Abdominal:     Comments: Deferred  Genitourinary:    Comments: Deferred Musculoskeletal:        General: Normal range of motion.     Cervical back: Normal range of motion.  Skin:    General: Skin is dry.  Neurological:     Mental Status: He is alert. He is disoriented.     Motor: No weakness.     Coordination: Coordination normal.    Review of Systems  Constitutional:  Negative for chills and fever.  HENT:  Negative for congestion, hearing loss and sore throat.   Eyes:  Negative for discharge and redness.  Respiratory:  Negative for cough, sputum production, shortness of breath and wheezing.   Cardiovascular:  Negative for chest pain and palpitations.  Gastrointestinal:  Negative for constipation, diarrhea, nausea and vomiting.  Genitourinary:  Negative for dysuria.  Musculoskeletal:  Negative for falls.  Neurological:  Negative for dizziness, tingling, tremors and headaches.  Endo/Heme/Allergies:        See allergy listing  Psychiatric/Behavioral:  Positive for memory loss. Negative for depression, hallucinations, substance abuse and suicidal ideas. The patient is nervous/anxious. The patient does not have insomnia.        +Delusional  All other systems reviewed and are negative.  Blood pressure 122/79, pulse 95, temperature 98 F (36.7 C), resp. rate 18, height 5\' 10"  (1.778 m), weight 58.3 kg, SpO2 97%. Body mass index is 18.45 kg/m.   Treatment Plan Summary: Daily contact with  patient to assess and evaluate  symptoms and progress in treatment and Medication management  Assessment: Principal/active diagnoses: Schizophrenia, paranoid type  Plan: The risks/benefits/side-effects/alternatives to the medications in use were discussed in detail with the patient and time was given for patient's questions. The patient consents to medication trial.     Safety and Monitoring: Involuntary admission to inpatient psychiatric unit for safety, stabilization and treatment Daily contact with patient to assess and evaluate symptoms and progress in treatment Patient's case to be discussed in multi-disciplinary team meeting Observation Level : q15 minute checks Vital signs: q12 hours Precautions: Safety   Medication plan: -Discontinue olanzapine 20 mg po for mood control (home medication).   -Start Haldol 5 mg twice daily to target psychotic symptoms. -Start Cogentin 0.5 mg twice daily  -Continue Depakote ER 1,000 mg po Q bedtime for mood stabilization (home med).  1-19 VA level: 72 NH3: 50 -continue lactulose 10g bid  LFTs wnl 08/10/2023: Ammonia level ordered, liver function tests ordered  -Continue Albuterol solution 2.5 mg Q 6 hours prn for SOB.  -Continue Nicotine patch 14 mg trans-dermally Q 24 hours for nicotine withdrawal.   CK has down trended to normal range    Agitation protocol: ---Haldol 5 mg, oral, 3 times daily as needed, mild agitation --Benadryl 50 mg, oral, 3 times daily as needed, mild agitation      OR                                --Haldol injection 5 mg, IM, 3 times daily as needed, moderate agitation --Benadryl injection 50 mg, IM, 3 times daily as needed, moderate agitation --Ativan injection 2 mg, IM, 3 times daily as needed, moderate agitation         OR                             --Haldol injection 5 mg, IM, 3 times daily as needed, severe agitation --Benadryl injection 50 mg, IM, 3 times daily as needed, severe agitation --Ativan injection 2 mg, IM, 3 times daily as  needed, severe agitation   Other PRNS -Continue Tylenol 650 mg every 6 hours PRN for mild pain -Continue Maalox 30 ml Q 4 hrs PRN for indigestion -Continue MOM 30 ml po Q 6 hrs for constipation  Discharge Planning: Social work and case management to assist with discharge planning and identification of hospital follow-up needs prior to discharge Estimated LOS: 3-4 more days if approaching baseline  Discharge Concerns: Need to establish a safety plan; Medication compliance and effectiveness Discharge Goals: Return home with outpatient referrals for mental health follow-up including medication management/psychotherapy  Norma Fredrickson, NP 08/11/2023, 2:44 PM  Patient ID: Phyllis Ginger, male   DOB: Sep 25, 1980, 43 y.o.   MRN: [id number] Patient ID: SHAYLON MANGAL, male   DOB: 09-29-1980, 43 y.o.   MRN: [id number]

## 2023-08-11 NOTE — Group Note (Signed)
Date:  08/11/2023 Time:  4:38 PM  Group Topic/Focus:  Dimensions of Wellness:   The focus of this group is to introduce the topic of wellness and discuss the role each dimension of wellness plays in total health.    Participation Level:  Did Not Attend  Participation Quality:   n/a  Affect:   n/a  Cognitive:   n/a  Insight: None  Engagement in Group:   n/a  Modes of Intervention:   n/a  Additional Comments:   Pt did not attend.  Edmund Hilda Kaeden Mester 08/11/2023, 4:38 PM

## 2023-08-11 NOTE — Plan of Care (Signed)
  Problem: Activity: Goal: Sleeping patterns will improve Outcome: Progressing   

## 2023-08-11 NOTE — Group Note (Signed)
Recreation Therapy Group Note   Group Topic:Other  Group Date: 08/11/2023 Start Time: 1405 End Time: 1445 Facilitators: Ruchama Kubicek-McCall, LRT,CTRS Location: [address] Dayroom   Activity Description/Intervention: Therapeutic Drumming. Patients with peers and staff were given the opportunity to engage in a leader facilitated [facility] Empowerment Drumming Circle with staff from the [employer], in partnership with [employer]. Teaching laboratory technician and trained [employer], Theodoro Doing leading with LRT observing and documenting intervention and pt response. This evidenced-based practice targets 7 areas of health and wellbeing in the human experience including: stress-reduction, exercise, self-expression, camaraderie/support, nurturing, spirituality, and music-making (leisure).   Goal Area(s) Addresses:  Patient will engage in pro-social way in music group.  Patient will follow directions of drum leader on the first prompt. Patient will demonstrate no behavioral issues during group.  Patient will identify if a reduction in stress level occurs as a result of participation in therapeutic drum circle.    Education: Leisure exposure, Pharmacologist, Musical expression, Discharge Planning   Affect/Mood: N/A   Participation Level: Did not attend    Clinical Observations/Individualized Feedback:     Plan: Continue to engage patient in RT group sessions 2-3x/week.   Viliami Bracco-McCall, LRT,CTRS 08/11/2023 3:05 PM

## 2023-08-11 NOTE — Group Note (Signed)
Date:  08/11/2023 Time:  10:17 AM  Group Topic/Focus:  Goals Group:   The focus of this group is to help patients establish daily goals to achieve during treatment and discuss how the patient can incorporate goal setting into their daily lives to aide in recovery. Orientation:   The focus of this group is to educate the patient on the purpose and policies of crisis stabilization and provide a format to answer questions about their admission.  The group details unit policies and expectations of patients while admitted.    Participation Level:  Did Not Attend  Participation Quality:   n/a  Affect:   n/a  Cognitive:   n/a  Insight: None  Engagement in Group:   n/a  Modes of Intervention:   n/a  Additional Comments:   Pt did not attend.  Edmund Hilda Daliah Chaudoin 08/11/2023, 10:17 AM

## 2023-08-11 NOTE — [facility] Suicide Risk Assessment (Signed)
[facility] INPATIENT:  Family/Significant Other Suicide Prevention Education  Suicide Prevention Education:  Education Completed; Glory Rosebush (grandmother)- [phone] ,  (name of family member/significant other) has been identified by the patient as the family member/significant other with whom the patient will be residing, and identified as the person(s) who will aid the patient in the event of a mental health crisis (suicidal ideations/suicide attempt).  With written consent from the patient, the family member/significant other has been provided the following suicide prevention education, prior to the and/or following the discharge of the patient.  The suicide prevention education provided includes the following: Suicide risk factors Suicide prevention and interventions National Suicide Hotline telephone number [facility] assessment telephone number [facility] Emergency Assistance 911 [facility] and/or Residential Mobile Crisis Unit telephone number  Request made of family/significant other to: Remove weapons (e.g., guns, rifles, knives), all items previously/currently identified as safety concern.   Remove drugs/medications (over-the-counter, prescriptions, illicit drugs), all items previously/currently identified as a safety concern.  The family member/significant other verbalizes understanding of the suicide prevention education information provided.  The family member/significant other agrees to remove the items of safety concern listed above.  Steffanie Dunn LCSWA 08/11/2023, 4:10 PM

## 2023-08-11 NOTE — Progress Notes (Signed)
   08/10/23 2200  Psych Admission Type (Psych Patients Only)  Admission Status Involuntary  Psychosocial Assessment  Patient Complaints Irritability Pagliarulo is not my name. My name is Esmeralda Arthur.  Leata Mouse is the name of my baby growing inside me.  I have male and male body parts.")  Eye Contact Poor;Suspiciousness  Facial Expression Angry  Affect Irritable  Speech Pressured  Interaction Guarded  Motor Activity Slow  Appearance/Hygiene In scrubs;Poor hygiene  Behavior Characteristics Unwilling to participate;Other (Comment) (Took meds with persistant encouragement)  Mood Preoccupied;Irritable;Suspicious ("why do you all keep bothering me.  I don't want to take that brown liquid is nasty as hell.  I'm tired of being here.")  Thought Process  Coherency Disorganized  Content Blaming others;Delusions;Paranoia  Delusions Paranoid ("what did you put in my drink?" Drank gingerale when denied anything was in drink except ice requested.)  Perception Derealization;Illusions  Hallucination None reported or observed  Judgment Poor  Confusion UTA  Danger to Self  Current suicidal ideation? Denies  Agreement Not to Harm Self Yes  Description of Agreement Verbal  Danger to Others  Danger to Others None reported or observed

## 2023-08-11 NOTE — Progress Notes (Signed)
   08/11/23 1000  Psych Admission Type (Psych Patients Only)  Admission Status Involuntary  Psychosocial Assessment  Patient Complaints Irritability  Eye Contact Suspiciousness;Poor  Facial Expression Flat;Anxious  Affect Irritable  Speech Logical/coherent  Interaction Isolative  Motor Activity Slow  Appearance/Hygiene In scrubs;Disheveled  Behavior Characteristics Unwilling to participate  Mood Anxious;Irritable  Thought Process  Coherency Disorganized  Content Blaming others;Delusions  Delusions Paranoid  Perception Hallucinations;Derealization  Hallucination None reported or observed  Judgment Poor  Confusion Mild  Danger to Self  Current suicidal ideation? Denies  Agreement Not to Harm Self Yes  Description of Agreement verbal  Danger to Others  Danger to Others None reported or observed

## 2023-08-11 NOTE — [facility] Group Notes (Signed)
[facility] Group Notes:  (Nursing/MHT/Case Management/Adjunct)  Date:  08/11/2023  Time:  9:23 PM  Type of Therapy:   NA/wrap-up group  Participation Level:  Did Not Attend  Participation Quality:    Affect:    Cognitive:    Insight:    Engagement in Group:    Modes of Intervention:    Summary of Progress/Problems: Pt refused to attend any groups offered. Writer provided worksheet for box breathing.   Cesar Robinson 08/11/2023, 9:23 PM

## 2023-08-11 NOTE — Plan of Care (Signed)
  Problem: Activity: Goal: Interest or engagement in activities will improve Outcome: Not Progressing:  Isolative to room   Problem: Education: Goal: Emotional status will improve Outcome: Not Progressing Goal: Mental status will improve Outcome: Not Progressing Goal: Verbalization of understanding the information provided will improve Outcome: Not Progressing

## 2023-08-12 DIAGNOSIS — F200 Paranoid schizophrenia: Secondary | ICD-10-CM | POA: Diagnosis not present

## 2023-08-12 NOTE — Progress Notes (Signed)

## 2023-08-12 NOTE — Plan of Care (Signed)
  Problem: Education: Goal: Knowledge of [facility] Education information/materials will improve Outcome: Progressing Goal: Emotional status will improve Outcome: Progressing Goal: Mental status will improve Outcome: Progressing Goal: Verbalization of understanding the information provided will improve Outcome: Progressing   Problem: Activity: Goal: Interest or engagement in activities will improve Outcome: Progressing   

## 2023-08-12 NOTE — [facility] Group Notes (Signed)
[facility] Group Notes:  (Nursing/MHT/Case Management/Adjunct)  Date:  08/12/2023  Time:  2000  Type of Therapy:   Wrap up group  Participation Level:  Minimal  Participation Quality:  Sharing and Guarded  Affect:  Resistant  Cognitive:  Hallucinating  Insight:  Limited  Engagement in Group:  Developing/Improving  Modes of Intervention:  Clarification, Education, and Support  Summary of Progress/Problems: Positive thinking and positive change were discussed. Pt joined group towards the end after other patients had already shared.   Marcille Buffy 08/12/2023, 9:04 PM

## 2023-08-12 NOTE — [facility] Group Notes (Signed)

## 2023-08-12 NOTE — Group Note (Signed)
[facility] LCSW Group Therapy Note   Group Date: 08/12/2023 Start Time: 1100 End Time: 1200   Type of Therapy/Topic:  Group Therapy:  Emotion Regulation  Participation Level:  Did Not Attend   Mood: N/A  Description of Group:    The purpose of this group is to assist patients in learning to regulate negative emotions and experience positive emotions. Patients will be guided to discuss ways in which they have been vulnerable to their negative emotions. These vulnerabilities will be juxtaposed with experiences of positive emotions or situations, and patients challenged to use positive emotions to combat negative ones. Special emphasis will be placed on coping with negative emotions in conflict situations, and patients will process healthy conflict resolution skills.  Therapeutic Goals: Patient will identify two positive emotions or experiences to reflect on in order to balance out negative emotions:  Patient will label two or more emotions that they find the most difficult to experience:  Patient will be able to demonstrate positive conflict resolution skills through discussion or role plays:   Summary of Patient Progress:   Pt was invited did not attend    Therapeutic Modalities:   Cognitive Behavioral Therapy Feelings Identification Dialectical Behavioral Therapy   Steffanie Dunn, LCSW

## 2023-08-12 NOTE — Group Note (Signed)
Date:  08/12/2023 Time:  10:00 AM  Group Topic/Focus:  Goals Group:   The focus of this group is to help patients establish daily goals to achieve during treatment and discuss how the patient can incorporate goal setting into their daily lives to aide in recovery. Orientation:   The focus of this group is to educate the patient on the purpose and policies of crisis stabilization and provide a format to answer questions about their admission.  The group details unit policies and expectations of patients while admitted.    Participation Level:  Did Not Attend  Additional Comments:  Patient was encouraged to attend group multiple times.   Azir Muzyka T Lamondre Wesche 08/12/2023, 10:00 AM

## 2023-08-12 NOTE — Progress Notes (Addendum)
[facility] MD Progress Note  08/12/2023 8:44 AM Cesar Robinson  MRN:  [id number]  HPI: Per chart review:  The patient is a 43 year old male with a history of schizophrenia presents to the emergency department escorted by [employer]. Much of the history is provided by police secondary to acute psychotic episode. Per [employer], patient's brother notes noncompliance with psychiatric medications for an unknown length of time. The patient left his home today and was found standing outside in the lawn of an unknown home. Police tried to transport the patient back to his residence where the patient did not recognize his twin brother and said that his brother tried to kill his mother. The patient has been exceedingly paranoid with police. Not making any suicidal or homicidal remarks. Challenging to redirect, but nonviolent.   Chart Review from last 24 hours:  The patient's chart and nursing notes were reviewed. The patient's case was discussed in multidisciplinary team meeting. Vital signs within defined limits. Patient is compliant with routine medication regimen without difficulty. Sleep hours last night: 7.25 hours, as documented in the nursing flow sheets. Nursing reports patient is pleasant though paranoid;  he looks at medication labels prior to administration, and thinks he is discharging home today.  No behavioral episodes reported at this time. No PRN medication was required, according to the nursing record   Patient assessment 1/23:  The patient was seen face-to-face by this provider and case staffed with attending psychiatrist, N. Abbott Pao.   During today's encounter, the patient was observed lying in bed awake. On today's assessment, the patient described his mood as "I am okay" and verbalize feelings of sleepiness. He denied any difficulty falling or staying asleep the previous night. His appetite was reported as satisfactory, stating he ate breakfast this morning and dinner yesterday evening.  He denies suicidal or  homicidal ideation, as well as auditory or visual hallucinations, paranoia, or delusional thought content. Additionally, he denied symptoms of anxiety or depression.  The patient reported no side effects from his psychotropic medication. When asked about attending group sessions, he stated that he went to one a few days ago and asked if this writer would also be present. He was reassured that I would not attend the group but would be in the vicinity.  He was alert and oriented to person, place, and time, although he was unsure of the day of the week.  When asked about his understanding of his hospitalization, he responded, "I think for medicine."  There appears to be slight improvement, as he seems less preoccupied than the previous day and slightly more coherent during today's assessment. A possible adjustment to increase Haldol will be considered tomorrow, but changes will be made cautiously.   No signs of TD (Tardive Dyskinesia) or EPS (Extrapyramidal Symptoms) were observed during the assessment, and the patient reports no feelings of stiffness. AIMS score: 0.   No adjustments were made to his medication regimen today.    The following medication adjustments were made yesterday 1/22: -Discontinue olanzapine 20 mg (home medication) due to ineffectiveness. -Start Haldol 5 mg twice daily to target psychotic symptoms. -Start Cogentin 0.5 mg twice daily for EPS prevention.   Continued hospitalization remains necessary at this time, to treat and stabilize mental status prior to discharge.    Principal Problem: Schizophrenia (HCC) Diagnosis: Principal Problem:   Schizophrenia (HCC)  Total Time spent with patient: 35 minutes  Past Psychiatric History:  Per H&P, patient previously diagnosed with schizophrenia and was taking Zyprexa and Depakote  as outpatient.  He has 1 previous hospitalization but has been apparently stable for about 10 years.  Past Medical History:  Past Medical History:   Diagnosis Date   Schizophrenia (HCC)    Sleep apnea    History reviewed. No pertinent surgical history. Family History: History reviewed. No pertinent family history. Family Psychiatric  History: See H&P Social History:  Social History   Substance and Sexual Activity  Alcohol Use Not Currently   Comment: occ     Social History   Substance and Sexual Activity  Drug Use Yes   Types: Marijuana   Comment: Uses "a couple weekends"    Social History   Socioeconomic History   Marital status: Single    Spouse name: Not on file   Number of children: Not on file   Years of education: Not on file   Highest education level: Not on file  Occupational History   Occupation: unemployed  Tobacco Use   Smoking status: Every Day    Current packs/day: 0.25    Types: Cigarettes   Smokeless tobacco: Not on file   Tobacco comments:    Black and Mild, declines cessation  Vaping Use   Vaping status: Never Used  Substance and Sexual Activity   Alcohol use: Not Currently    Comment: occ   Drug use: Yes    Types: Marijuana    Comment: Uses "a couple weekends"   Sexual activity: Yes  Other Topics Concern   Not on file  Social History Narrative   Not on file   Social Drivers of Health   Financial Resource Strain: Not on file  Food Insecurity: Food Insecurity Present (08/05/2023)   Hunger Vital Sign    Worried About Running Out of Food in the Last Year: Patient unable to answer    Ran Out of Food in the Last Year: Sometimes true  Transportation Needs: Patient Unable To Answer (08/05/2023)   PRAPARE - Administrator, Civil Service (Medical): Patient unable to answer    Lack of Transportation (Non-Medical): Patient unable to answer  Physical Activity: Not on file  Stress: Not on file  Social Connections: Not on file   Additional Social History:    Current Medications: Current Facility-Administered Medications  Medication Dose Route Frequency Provider Last Rate Last  Admin   acetaminophen (TYLENOL) tablet 650 mg  650 mg Oral Q6H PRN Peterson Ao, MD       albuterol (PROVENTIL) (2.5 MG/3ML) 0.083% nebulizer solution 2.5 mg  2.5 mg Nebulization Q6H PRN Peterson Ao, MD       alum & mag hydroxide-simeth (MAALOX/MYLANTA) 200-200-20 MG/5ML suspension 30 mL  30 mL Oral Q4H PRN Peterson Ao, MD       benztropine (COGENTIN) tablet 0.5 mg  0.5 mg Oral BID Aleanna Menge H, NP   0.5 mg at 08/12/23 5621   haloperidol (HALDOL) tablet 5 mg  5 mg Oral TID PRN Peterson Ao, MD       And   diphenhydrAMINE (BENADRYL) capsule 50 mg  50 mg Oral TID PRN Peterson Ao, MD       haloperidol lactate (HALDOL) injection 5 mg  5 mg Intramuscular TID PRN Massengill, Harrold Donath, MD       And   diphenhydrAMINE (BENADRYL) injection 50 mg  50 mg Intramuscular TID PRN Massengill, Harrold Donath, MD       And   LORazepam (ATIVAN) injection 2 mg  2 mg Intramuscular TID PRN Phineas Inches, MD  haloperidol lactate (HALDOL) injection 10 mg  10 mg Intramuscular TID PRN Massengill, Harrold Donath, MD       And   diphenhydrAMINE (BENADRYL) injection 50 mg  50 mg Intramuscular TID PRN Massengill, Harrold Donath, MD       And   LORazepam (ATIVAN) injection 2 mg  2 mg Intramuscular TID PRN Phineas Inches, MD       divalproex (DEPAKOTE ER) 24 hr tablet 1,000 mg  1,000 mg Oral QHS Peterson Ao, MD   1,000 mg at 08/11/23 2121   haloperidol (HALDOL) tablet 5 mg  5 mg Oral BID Willeen Cass, Anupama Piehl H, NP   5 mg at 08/12/23 0805   lactulose (CHRONULAC) 10 GM/15ML solution 10 g  10 g Oral BID Massengill, Harrold Donath, MD   10 g at 08/10/23 0900   magnesium hydroxide (MILK OF MAGNESIA) suspension 30 mL  30 mL Oral Daily PRN Peterson Ao, MD       nicotine (NICODERM CQ - dosed in mg/24 hours) patch 14 mg  14 mg Transdermal Daily Peterson Ao, MD   14 mg at 08/10/23 0901   traZODone (DESYREL) tablet 50 mg  50 mg Oral QHS Jearld Lesch, NP   50 mg at 08/11/23 2124   Lab Results:  Results for orders  placed or performed during the hospital encounter of 08/05/23 (from the past 48 hours)  Hepatic function panel     Status: None   Collection Time: 08/10/23  6:30 PM  Result Value Ref Range   Total Protein 7.4 6.5 - 8.1 g/dL   Albumin 4.0 3.5 - 5.0 g/dL   AST 23 15 - 41 U/L   ALT 9 0 - 44 U/L   Alkaline Phosphatase 51 38 - 126 U/L   Total Bilirubin 0.8 0.0 - 1.2 mg/dL   Bilirubin, Direct 0.2 0.0 - 0.2 mg/dL   Indirect Bilirubin 0.6 0.3 - 0.9 mg/dL    Comment: Performed at [facility], [address] [place], [place] [address]  Ammonia     Status: None   Collection Time: 08/10/23  6:30 PM  Result Value Ref Range   Ammonia 34 9 - 35 umol/L    Comment: HEMOLYSIS AT THIS LEVEL MAY AFFECT RESULT Performed at [facility], [address] [place], [place] [address]     Blood Alcohol level:  Lab Results  Component Value Date   ETH <10 08/01/2023    Metabolic Disorder Labs: Lab Results  Component Value Date   HGBA1C 5.7 (H) 08/07/2023   MPG 116.89 08/07/2023   MPG 119.76 08/06/2023   No results found for: "PROLACTIN" Lab Results  Component Value Date   CHOL 159 08/06/2023   TRIG 92 08/06/2023   HDL 47 08/06/2023   CHOLHDL 3.4 08/06/2023   VLDL 18 08/06/2023   LDLCALC 94 08/06/2023    Physical Findings: AIMS: Facial and Oral Movements Muscles of Facial Expression: None Lips and Perioral Area: None Jaw: None Tongue: None,Extremity Movements Upper (arms, wrists, hands, fingers): None Lower (legs, knees, ankles, toes): None, Trunk Movements Neck, shoulders, hips: None, Global Judgements Severity of abnormal movements overall : None Incapacitation due to abnormal movements: None Patient's awareness of abnormal movements: No Awareness, Dental Status Current problems with teeth and/or dentures?: No Does patient usually wear dentures?: No Edentia?: No  CIWA:    COWS:     Musculoskeletal: Strength & Muscle Tone: within normal  limits Gait & Station: normal Patient leans: N/A  Psychiatric Specialty Exam:  Presentation  General Appearance:  Bizarre  Eye Contact: Minimal  Speech: Clear and Coherent  Speech Volume: Normal  Handedness: Right  Mood and Affect  Mood: Anxious; Dysphoric  Affect: Flat; Congruent  Thought Process  Thought Processes: Less disorganized  Descriptions of Associations: More linear, but tangential at times  Orientation:Full (Time, Place and Person) (Disoriented to situation)  Thought Content: Confused, at times illogical  History of Schizophrenia/Schizoaffective disorder:Yes  Duration of Psychotic Symptoms:Greater than six months  Hallucinations:Hallucinations: None; Other (comment) (Denies)  Ideas of Reference: Delusions?  Suicidal Thoughts:Suicidal Thoughts: No  Homicidal Thoughts:Homicidal Thoughts: No  Sensorium  Memory: Immediate Fair; Recent Fair  Judgment: Impaired  Insight: Lacking  Executive Functions  Concentration: Poor  Attention Span: Fair  Recall: Fair  Fund of Knowledge: Poor  Language: Fair  Psychomotor Activity  Psychomotor Activity: Psychomotor Activity: Normal  Assets  Assets: Housing; Social Support  Sleep  Sleep: Sleep: Good  Physical Exam: Physical Exam Vitals and nursing note reviewed.  Constitutional:      General: He is not in acute distress.    Appearance: He is normal weight. He is not toxic-appearing.  HENT:     Head: Normocephalic.     Nose: Nose normal.     Mouth/Throat:     Pharynx: Oropharynx is clear.  Eyes:     Conjunctiva/sclera: Conjunctivae normal.  Cardiovascular:     Pulses: Normal pulses.  Pulmonary:     Effort: No respiratory distress.  Abdominal:     Comments: Deferred  Genitourinary:    Comments: Deferred Musculoskeletal:        General: Normal range of motion.     Cervical back: Normal range of motion.  Skin:    General: Skin is dry.  Neurological:     Mental  Status: He is alert. He is disoriented.     Motor: No weakness.     Coordination: Coordination normal.    Review of Systems  Constitutional:  Negative for chills and fever.  HENT:  Negative for congestion, hearing loss and sore throat.   Eyes:  Negative for discharge and redness.  Respiratory:  Negative for cough, sputum production, shortness of breath and wheezing.   Cardiovascular:  Negative for chest pain and palpitations.  Gastrointestinal:  Negative for constipation, diarrhea, nausea and vomiting.  Genitourinary:  Negative for dysuria.  Musculoskeletal:  Negative for falls.  Neurological:  Negative for dizziness, tingling, tremors and headaches.  Endo/Heme/Allergies:        See allergy listing  Psychiatric/Behavioral:  Negative for depression, hallucinations, memory loss, substance abuse and suicidal ideas. The patient is nervous/anxious. The patient does not have insomnia.        +Delusional  All other systems reviewed and are negative.  Blood pressure 113/83, pulse 97, temperature 97.9 F (36.6 C), temperature source Oral, resp. rate 16, height 5\' 10"  (1.778 m), weight 58.3 kg, SpO2 100%. Body mass index is 18.45 kg/m.   Treatment Plan Summary: Daily contact with patient to assess and evaluate symptoms and progress in treatment and Medication management  Assessment: Principal/active diagnoses: Schizophrenia, paranoid type  Plan: The risks/benefits/side-effects/alternatives to the medications in use were discussed in detail with the patient and time was given for patient's questions. The patient consents to medication trial.     Safety and Monitoring: Involuntary admission to inpatient psychiatric unit for safety, stabilization and treatment Daily contact with patient to assess and evaluate symptoms and progress in treatment Patient's case to be discussed in multi-disciplinary team meeting Observation Level : q15  minute checks Vital signs: q12 hours Precautions:  Safety   Medication plan: -Discontinued olanzapine 20 mg po due to ineffectiveness (home medication) 1/22.  -Continue Haldol 5 mg twice daily to target psychotic symptoms. -Continue Cogentin 0.5 mg twice daily  -Continue Depakote ER 1,000 mg po Q bedtime for mood stabilization (home med).  1-19 VA level: 72 NH3: 50 -continue lactulose 10g bid  LFTs wnl 08/10/2023: Ammonia level ordered, liver function tests ordered  -Continue Albuterol solution 2.5 mg Q 6 hours prn for SOB.  -Continue Nicotine patch 14 mg trans-dermally Q 24 hours for nicotine withdrawal.   CK has down trended to normal range    Agitation protocol: ---Haldol 5 mg, oral, 3 times daily as needed, mild agitation --Benadryl 50 mg, oral, 3 times daily as needed, mild agitation      OR                                --Haldol injection 5 mg, IM, 3 times daily as needed, moderate agitation --Benadryl injection 50 mg, IM, 3 times daily as needed, moderate agitation --Ativan injection 2 mg, IM, 3 times daily as needed, moderate agitation         OR                             --Haldol injection 5 mg, IM, 3 times daily as needed, severe agitation --Benadryl injection 50 mg, IM, 3 times daily as needed, severe agitation --Ativan injection 2 mg, IM, 3 times daily as needed, severe agitation   Other PRNS -Continue Tylenol 650 mg every 6 hours PRN for mild pain -Continue Maalox 30 ml Q 4 hrs PRN for indigestion -Continue MOM 30 ml po Q 6 hrs for constipation  Discharge Planning: Social work and case management to assist with discharge planning and identification of hospital follow-up needs prior to discharge Estimated LOS: 3-4 more days if approaching baseline  Discharge Concerns: Need to establish a safety plan; Medication compliance and effectiveness Discharge Goals: Return home with outpatient referrals for mental health follow-up including medication management/psychotherapy  Norma Fredrickson, NP 08/12/2023, 8:44  AM  Patient ID: Cesar Robinson, male   DOB: Jan 02, 1981, 43 y.o.   MRN: [id number] Patient ID: Cesar Robinson, male   DOB: [birth year], 43 y.o.   MRN: [id number] Patient ID: Cesar Robinson, male   DOB: [birth year], 43 y.o.   MRN: [id number]

## 2023-08-12 NOTE — Plan of Care (Signed)
  Problem: Health Behavior/Discharge Planning: Goal: Compliance with treatment plan for underlying cause of condition will improve Outcome: Progressing   Problem: Safety: Goal: Periods of time without injury will increase Outcome: Progressing   

## 2023-08-12 NOTE — Progress Notes (Signed)
Patient states he slept ok last night and thought he was discharging today. Patient denies SI,HI, and A/V/H with no plan or intent and is med compliant. Patient remains mostly isolative to his room and has not attended groups today. Patient did not eat lunch despite providing encouragement. Patient denies any pain or discomfort. Will continue to encourage patient's participation in milieu and for meals. No s/s of current distress.

## 2023-08-13 ENCOUNTER — Encounter (HOSPITAL_COMMUNITY): Payer: Self-pay

## 2023-08-13 DIAGNOSIS — F200 Paranoid schizophrenia: Secondary | ICD-10-CM | POA: Diagnosis not present

## 2023-08-13 MED ORDER — TRAZODONE HCL 100 MG PO TABS
100.0000 mg | ORAL_TABLET | Freq: Every day | ORAL | Status: DC
  Administered 2023-08-13 – 2023-08-16 (×4): 100 mg via ORAL
  Filled 2023-08-13 (×7): qty 1

## 2023-08-13 MED ORDER — HALOPERIDOL 5 MG PO TABS
10.0000 mg | ORAL_TABLET | Freq: Two times a day (BID) | ORAL | Status: DC
  Administered 2023-08-13 – 2023-08-17 (×8): 10 mg via ORAL
  Filled 2023-08-13 (×13): qty 2

## 2023-08-13 NOTE — Group Note (Signed)
Date:  08/13/2023 Time:  10:16 PM  Group Topic/Focus:  Wrap-Up Group:   The focus of this group is to help patients review their daily goal of treatment and discuss progress on daily workbooks.    Participation Level:  Did Not Attend  Scot Dock 08/13/2023, 10:16 PM

## 2023-08-13 NOTE — Progress Notes (Signed)
   08/13/23 2157  Psych Admission Type (Psych Patients Only)  Admission Status Involuntary  Psychosocial Assessment  Patient Complaints None  Eye Contact Fair  Facial Expression Animated  Affect Appropriate to circumstance  Speech Logical/coherent  Interaction Minimal;Guarded  Motor Activity Other (Comment) (WDL)  Appearance/Hygiene Unremarkable  Behavior Characteristics Appropriate to situation  Mood Anxious  Thought Process  Coherency Disorganized  Content Delusions  Delusions Paranoid  Perception WDL  Hallucination None reported or observed  Judgment Impaired  Confusion None  Danger to Self  Current suicidal ideation? Denies  Description of Suicide Plan None  Agreement Not to Harm Self Yes  Description of Agreement verbal  Danger to Others  Danger to Others None reported or observed

## 2023-08-13 NOTE — Group Note (Signed)
Recreation Therapy Group Note   Group Topic:Team Building  Group Date: 08/13/2023 Start Time: 0930 End Time: 1000 Facilitators: Tilman Mcclaren-McCall, LRT,CTRS Location: [address] Dayroom   Group Topic: Communication, Team Building, Problem Solving  Goal Area(s) Addresses:  Patient will effectively work with peer towards shared goal.  Patient will identify skills used to make activity successful.  Patient will share challenges and verbalize solution-driven approaches used. Patient will identify how skills used during activity can be used to reach post d/c goals.   Intervention: STEM Activity   Activity: [employer]. Patients were provided the following materials: 4 drinking straws, 5 rubber bands, 5 paper clips, 2 index cards, and 2 drinking cups. Using the provided materials patients were asked to build a launching mechanism to launch a ping pong ball across the room, approximately 10 feet. Patients were divided into teams of 3-5. Instructions required all materials be incorporated into the device, functionality of items left to the peer group's discretion.  Education: Pharmacist, community, Scientist, physiological, Air cabin crew, Building control surveyor.   Education Outcome: Acknowledges education/In group clarification offered/Needs additional education.    Affect/Mood: N/A   Participation Level: Did not attend    Clinical Observations/Individualized Feedback:     Plan: Continue to engage patient in RT group sessions 2-3x/week.   Marzell Allemand-McCall, LRT,CTRS 08/13/2023 11:28 AM

## 2023-08-13 NOTE — Plan of Care (Signed)
Problem: Education: Goal: Knowledge of [facility] Education information/materials will improve Outcome: Progressing Goal: Emotional status will improve Outcome: Progressing Goal: Mental status will improve Outcome: Progressing Goal: Verbalization of understanding the information provided will improve Outcome: Progressing   Problem: Activity: Goal: Interest or engagement in activities will improve Outcome: Progressing Goal: Sleeping patterns will improve Outcome: Progressing   Problem: Coping: Goal: Ability to verbalize frustrations and anger appropriately will improve Outcome: Progressing Goal: Ability to demonstrate self-control will improve Outcome: Progressing   Problem: Health Behavior/Discharge Planning: Goal: Identification of resources available to assist in meeting health care needs will improve Outcome: Progressing Goal: Compliance with treatment plan for underlying cause of condition will improve Outcome: Progressing   Problem: Physical Regulation: Goal: Ability to maintain clinical measurements within normal limits will improve Outcome: Progressing   Problem: Safety: Goal: Periods of time without injury will increase Outcome: Progressing   Problem: Education: Goal: Knowledge of [facility] Education information/materials will improve Outcome: Progressing Goal: Emotional status will improve Outcome: Progressing Goal: Mental status will improve Outcome: Progressing Goal: Verbalization of understanding the information provided will improve Outcome: Progressing   Problem: Activity: Goal: Interest or engagement in activities will improve Outcome: Progressing Goal: Sleeping patterns will improve Outcome: Progressing   Problem: Coping: Goal: Ability to verbalize frustrations and anger appropriately will improve Outcome: Progressing Goal: Ability to demonstrate self-control will improve Outcome: Progressing   Problem: Health  Behavior/Discharge Planning: Goal: Identification of resources available to assist in meeting health care needs will improve Outcome: Progressing Goal: Compliance with treatment plan for underlying cause of condition will improve Outcome: Progressing   Problem: Physical Regulation: Goal: Ability to maintain clinical measurements within normal limits will improve Outcome: Progressing   Problem: Safety: Goal: Periods of time without injury will increase Outcome: Progressing   Problem: Activity: Goal: Will verbalize the importance of balancing activity with adequate rest periods Outcome: Progressing   Problem: Education: Goal: Will be free of psychotic symptoms Outcome: Progressing Goal: Knowledge of the prescribed therapeutic regimen will improve Outcome: Progressing   Problem: Coping: Goal: Coping ability will improve Outcome: Progressing Goal: Will verbalize feelings Outcome: Progressing   Problem: Health Behavior/Discharge Planning: Goal: Compliance with prescribed medication regimen will improve Outcome: Progressing   Problem: Nutritional: Goal: Ability to achieve adequate nutritional intake will improve Outcome: Progressing   Problem: Role Relationship: Goal: Ability to communicate needs accurately will improve Outcome: Progressing Goal: Ability to interact with others will improve Outcome: Progressing   Problem: Safety: Goal: Ability to redirect hostility and anger into socially appropriate behaviors will improve Outcome: Progressing Goal: Ability to remain free from injury will improve Outcome: Progressing   Problem: Self-Care: Goal: Ability to participate in self-care as condition permits will improve Outcome: Progressing   Problem: Self-Concept: Goal: Will verbalize positive feelings about self Outcome: Progressing

## 2023-08-13 NOTE — Plan of Care (Signed)
Problem: Education: Goal: Knowledge of [facility] Education information/materials will improve Outcome: Progressing Goal: Emotional status will improve Outcome: Progressing Goal: Mental status will improve Outcome: Progressing Goal: Verbalization of understanding the information provided will improve Outcome: Progressing   Problem: Activity: Goal: Interest or engagement in activities will improve Outcome: Progressing Goal: Sleeping patterns will improve Outcome: Progressing

## 2023-08-13 NOTE — BH IP Treatment Plan (Signed)
Interdisciplinary Treatment and Diagnostic Plan Update  08/13/2023 Time of Session: 11:15 AM - UPDATE Cesar Robinson MRN: [id number]  Principal Diagnosis: Schizophrenia (HCC)  Secondary Diagnoses: Principal Problem:   Schizophrenia (HCC)   Current Medications:  Current Facility-Administered Medications  Medication Dose Route Frequency Provider Last Rate Last Admin   acetaminophen (TYLENOL) tablet 650 mg  650 mg Oral Q6H PRN Peterson Ao, MD       albuterol (PROVENTIL) (2.5 MG/3ML) 0.083% nebulizer solution 2.5 mg  2.5 mg Nebulization Q6H PRN Peterson Ao, MD       alum & mag hydroxide-simeth (MAALOX/MYLANTA) 200-200-20 MG/5ML suspension 30 mL  30 mL Oral Q4H PRN Peterson Ao, MD       benztropine (COGENTIN) tablet 0.5 mg  0.5 mg Oral BID Bennett, Christal H, NP   0.5 mg at 08/13/23 1324   haloperidol (HALDOL) tablet 5 mg  5 mg Oral TID PRN Peterson Ao, MD       And   diphenhydrAMINE (BENADRYL) capsule 50 mg  50 mg Oral TID PRN Peterson Ao, MD       haloperidol lactate (HALDOL) injection 5 mg  5 mg Intramuscular TID PRN Massengill, Harrold Donath, MD       And   diphenhydrAMINE (BENADRYL) injection 50 mg  50 mg Intramuscular TID PRN Massengill, Harrold Donath, MD       And   LORazepam (ATIVAN) injection 2 mg  2 mg Intramuscular TID PRN Massengill, Harrold Donath, MD       haloperidol lactate (HALDOL) injection 10 mg  10 mg Intramuscular TID PRN Massengill, Harrold Donath, MD       And   diphenhydrAMINE (BENADRYL) injection 50 mg  50 mg Intramuscular TID PRN Massengill, Harrold Donath, MD       And   LORazepam (ATIVAN) injection 2 mg  2 mg Intramuscular TID PRN Phineas Inches, MD       divalproex (DEPAKOTE ER) 24 hr tablet 1,000 mg  1,000 mg Oral QHS Peterson Ao, MD   1,000 mg at 08/12/23 2007   haloperidol (HALDOL) tablet 10 mg  10 mg Oral BID Bennett, Christal H, NP       lactulose (CHRONULAC) 10 GM/15ML solution 10 g  10 g Oral BID Massengill, Harrold Donath, MD   10 g at 08/10/23 0900   magnesium  hydroxide (MILK OF MAGNESIA) suspension 30 mL  30 mL Oral Daily PRN Peterson Ao, MD       nicotine (NICODERM CQ - dosed in mg/24 hours) patch 14 mg  14 mg Transdermal Daily Peterson Ao, MD   14 mg at 08/10/23 0901   traZODone (DESYREL) tablet 100 mg  100 mg Oral QHS Bennett, Christal H, NP       PTA Medications: Medications Prior to Admission  Medication Sig Dispense Refill Last Dose/Taking   divalproex (DEPAKOTE ER) 500 MG 24 hr tablet Take 1,000 mg by mouth at bedtime. (Patient not taking: Reported on 08/02/2023)      OLANZapine (ZYPREXA) 20 MG tablet Take 20 mg by mouth at bedtime. (Patient not taking: Reported on 08/02/2023)       Patient Stressors: Medication change or noncompliance    Patient Strengths: Average or above average intelligence  Physical Health  Supportive family/friends   Treatment Modalities: Medication Management, Group therapy, Case management,  1 to 1 session with clinician, Psychoeducation, Recreational therapy.   Physician Treatment Plan for Primary Diagnosis: Schizophrenia [facility]) Long Term Goal(s): Improvement in symptoms so as ready for discharge   Short Term Goals: Ability to identify  and develop effective coping behaviors will improve Ability to maintain clinical measurements within normal limits will improve Compliance with prescribed medications will improve Ability to identify triggers associated with substance abuse/mental health issues will improve Ability to identify changes in lifestyle to reduce recurrence of condition will improve Ability to verbalize feelings will improve Ability to disclose and discuss suicidal ideas Ability to demonstrate self-control will improve  Medication Management: Evaluate patient's response, side effects, and tolerance of medication regimen.  Therapeutic Interventions: 1 to 1 sessions, Unit Group sessions and Medication administration.  Evaluation of Outcomes:  Progressing  Physician Treatment Plan for  Secondary Diagnosis: Principal Problem:   Schizophrenia (HCC)  Long Term Goal(s): Improvement in symptoms so as ready for discharge   Short Term Goals: Ability to identify and develop effective coping behaviors will improve Ability to maintain clinical measurements within normal limits will improve Compliance with prescribed medications will improve Ability to identify triggers associated with substance abuse/mental health issues will improve Ability to identify changes in lifestyle to reduce recurrence of condition will improve Ability to verbalize feelings will improve Ability to disclose and discuss suicidal ideas Ability to demonstrate self-control will improve     Medication Management: Evaluate patient's response, side effects, and tolerance of medication regimen.  Therapeutic Interventions: 1 to 1 sessions, Unit Group sessions and Medication administration.  Evaluation of Outcomes: Progressing   RN Treatment Plan for Primary Diagnosis: Schizophrenia (HCC) Long Term Goal(s): Knowledge of disease and therapeutic regimen to maintain health will improve  Short Term Goals: Ability to remain free from injury will improve, Ability to verbalize frustration and anger appropriately will improve, Ability to participate in decision making will improve, Ability to verbalize feelings will improve, Ability to identify and develop effective coping behaviors will improve, and Compliance with prescribed medications will improve  Medication Management: RN will administer medications as ordered by provider, will assess and evaluate patient's response and provide education to patient for prescribed medication. RN will report any adverse and/or side effects to prescribing provider.  Therapeutic Interventions: 1 on 1 counseling sessions, Psychoeducation, Medication administration, Evaluate responses to treatment, Monitor vital signs and CBGs as ordered, Perform/monitor CIWA, COWS, AIMS and Fall Risk  screenings as ordered, Perform wound care treatments as ordered.  Evaluation of Outcomes: Progressing   LCSW Treatment Plan for Primary Diagnosis: Schizophrenia (HCC) Long Term Goal(s): Safe transition to appropriate next level of care at discharge, Engage patient in therapeutic group addressing interpersonal concerns.  Short Term Goals: Engage patient in aftercare planning with referrals and resources, Increase ability to appropriately verbalize feelings, Facilitate acceptance of mental health diagnosis and concerns, and Identify triggers associated with mental health/substance abuse issues  Therapeutic Interventions: Assess for all discharge needs, 1 to 1 time with Social worker, Explore available resources and support systems, Assess for adequacy in community support network, Educate family and significant other(s) on suicide prevention, Complete Psychosocial Assessment, Interpersonal group therapy.  Evaluation of Outcomes: Progressing   Progress in Treatment: Attending groups: No. Participating in groups: No. Taking medication as prescribed: Yes Toleration medication: Yes Family/Significant other contact made: Yes, contacted Glory Rosebush (grandmother) [phone]  Patient understands diagnosis: Yes. Discussing patient identified problems/goals with staff: Yes. Medical problems stabilized or resolved: Yes. Denies suicidal/homicidal ideation: Yes. Issues/concerns per patient self-inventory: No.   New problem(s) identified: No, Describe:  none   New Short Term/Long Term Goal(s): medication stabilization, elimination of SI thoughts, development of comprehensive mental wellness plan.      Patient Goals:  "Good wellbeing"  Discharge Plan or Barriers: Patient recently admitted. CSW will continue to follow and assess for appropriate referrals and possible discharge planning.      Reason for Continuation of Hospitalization: Delusions  Hallucinations Medication  stabilization Other; describe paranoia   Estimated Length of Stay: 2 - 4 days  Last 3 [place] Suicide Severity Risk Score: Flowsheet Row [facility] (Current[facility]-Admission (Discharged) from 08/01/2023 in [facility]  C-SSRS RISK CATEGORY No Risk No Risk       Last PHQ 2/9 Scores:     No data to display          Scribe for Treatment Team: Nyra Jabs 08/13/2023 4:35 PM

## 2023-08-13 NOTE — Group Note (Signed)
Date:  08/13/2023 Time:  11:56 AM  Group Topic/Focus:  Goals Group:   The focus of this group is to help patients establish daily goals to achieve during treatment and discuss how the patient can incorporate goal setting into their daily lives to aide in recovery. Orientation:   The focus of this group is to educate the patient on the purpose and policies of crisis stabilization and provide a format to answer questions about their admission.  The group details unit policies and expectations of patients while admitted.    Participation Level:  Did Not Attend  Participation Quality:   n/a  Affect:   n/a  Cognitive:   n/a  Insight: None  Engagement in Group:   n/a  Modes of Intervention:   n/a  Additional Comments:   Pt did not attend.    Edmund Hilda Amiri Riechers 08/13/2023, 11:56 AM

## 2023-08-13 NOTE — Progress Notes (Cosign Needed Addendum)
[facility] MD Progress Note  08/13/2023 4:33 PM Cesar Robinson  MRN:  [id number]  HPI: Per chart review:  The patient is a 43 year old male with a history of schizophrenia presents to the emergency department escorted by [employer]. Much of the history is provided by police secondary to acute psychotic episode. Per [employer], patient's brother notes noncompliance with psychiatric medications for an unknown length of time. The patient left his home today and was found standing outside in the lawn of an unknown home. Police tried to transport the patient back to his residence where the patient did not recognize his twin brother and said that his brother tried to kill his mother. The patient has been exceedingly paranoid with police. Not making any suicidal or homicidal remarks. Challenging to redirect, but nonviolent.   Chart Review from last 24 hours:  The patient's chart and nursing notes were reviewed. The patient's case was discussed in multidisciplinary team meeting. Vital signs within defined limits. Patient is compliant with routine medication regimen without difficulty. Sleep hours last night: 5.0 hours, as documented in the nursing flow sheets. Nursing reports patient continues to exhibit paranoia regarding his medications, responding to internal/external stimuli,  and is isolative to his room. No behavioral episodes reported at this time. No PRN medication was required, according to the nursing record   Patient assessment 1/24:  The patient was seen face-to-face by this provider and case staffed with attending psychiatrist, N. Abbott Pao.   During today's encounter, the patient was observed lying in bed, awake. On today's assessment, he exhibited inappropriate laughter and appeared to be responding to internal/external stimuli, making fleeting eye contact. He also appeared to be switching back and forth between beds in his room as he was laying in the opposite bed from yesterday. The no roommate order will be renewed  for another 24 hours.    When asked about wanting to look at the labels before taking his medication, he denies feelings of paranoia and states that he simply wants to know the medication before taking it. His mood is described as "I feel alright." He reports satisfactory sleep and appetite.  He reports going to the caf for breakfast and lunch today and attending a group in the day room, although this is not reflected in the staff's documentation. He denies paranoia, delusional thoughts, or auditory and visual hallucinations.  He was alert and oriented to person, place, and time. When asked about his understanding of his hospitalization, he responded, "to get back on my medicine." He denies any side effects attributed to his psychotropic medication. No signs of TD (Tardive Dyskinesia) or EPS (Extrapyramidal Symptoms) were observed during the assessment, and the patient reports no feelings of stiffness. AIMS score: 0.   We discussed changes to the current medication regimen: -Titrate Haldol from 5 mg twice daily to 10 mg twice daily for psychosis and mood stabilization.    Continued hospitalization remains necessary at this time, to treat and stabilize mental status prior to discharge.    Principal Problem: Schizophrenia (HCC) Diagnosis: Principal Problem:   Schizophrenia (HCC)  Total Time spent with patient: 45 minutes  Past Psychiatric History:  Per H&P, patient previously diagnosed with schizophrenia and was taking Zyprexa and Depakote as outpatient.  He has 1 previous hospitalization but has been apparently stable for about 10 years.  Past Medical History:  Past Medical History:  Diagnosis Date   Schizophrenia (HCC)    Sleep apnea    History reviewed. No pertinent surgical history. Family History:  History reviewed. No pertinent family history. Family Psychiatric  History: See H&P Social History:  Social History   Substance and Sexual Activity  Alcohol Use Not Currently    Comment: occ     Social History   Substance and Sexual Activity  Drug Use Yes   Types: Marijuana   Comment: Uses "a couple weekends"    Social History   Socioeconomic History   Marital status: Single    Spouse name: Not on file   Number of children: Not on file   Years of education: Not on file   Highest education level: Not on file  Occupational History   Occupation: unemployed  Tobacco Use   Smoking status: Every Day    Current packs/day: 0.25    Types: Cigarettes   Smokeless tobacco: Not on file   Tobacco comments:    Black and Mild, declines cessation  Vaping Use   Vaping status: Never Used  Substance and Sexual Activity   Alcohol use: Not Currently    Comment: occ   Drug use: Yes    Types: Marijuana    Comment: Uses "a couple weekends"   Sexual activity: Yes  Other Topics Concern   Not on file  Social History Narrative   Not on file   Social Drivers of Health   Financial Resource Strain: Not on file  Food Insecurity: Food Insecurity Present (08/05/2023)   Hunger Vital Sign    Worried About Running Out of Food in the Last Year: Patient unable to answer    Ran Out of Food in the Last Year: Sometimes true  Transportation Needs: Patient Unable To Answer (08/05/2023)   PRAPARE - Administrator, Civil Service (Medical): Patient unable to answer    Lack of Transportation (Non-Medical): Patient unable to answer  Physical Activity: Not on file  Stress: Not on file  Social Connections: Not on file   Additional Social History:    Current Medications: Current Facility-Administered Medications  Medication Dose Route Frequency Provider Last Rate Last Admin   acetaminophen (TYLENOL) tablet 650 mg  650 mg Oral Q6H PRN Peterson Ao, MD       albuterol (PROVENTIL) (2.5 MG/3ML) 0.083% nebulizer solution 2.5 mg  2.5 mg Nebulization Q6H PRN Peterson Ao, MD       alum & mag hydroxide-simeth (MAALOX/MYLANTA) 200-200-20 MG/5ML suspension 30 mL  30 mL Oral  Q4H PRN Peterson Ao, MD       benztropine (COGENTIN) tablet 0.5 mg  0.5 mg Oral BID Sheranda Seabrooks H, NP   0.5 mg at 08/13/23 0943   haloperidol (HALDOL) tablet 5 mg  5 mg Oral TID PRN Peterson Ao, MD       And   diphenhydrAMINE (BENADRYL) capsule 50 mg  50 mg Oral TID PRN Peterson Ao, MD       haloperidol lactate (HALDOL) injection 5 mg  5 mg Intramuscular TID PRN Massengill, Harrold Donath, MD       And   diphenhydrAMINE (BENADRYL) injection 50 mg  50 mg Intramuscular TID PRN Massengill, Harrold Donath, MD       And   LORazepam (ATIVAN) injection 2 mg  2 mg Intramuscular TID PRN Massengill, Harrold Donath, MD       haloperidol lactate (HALDOL) injection 10 mg  10 mg Intramuscular TID PRN Massengill, Harrold Donath, MD       And   diphenhydrAMINE (BENADRYL) injection 50 mg  50 mg Intramuscular TID PRN Phineas Inches, MD       And  LORazepam (ATIVAN) injection 2 mg  2 mg Intramuscular TID PRN Phineas Inches, MD       divalproex (DEPAKOTE ER) 24 hr tablet 1,000 mg  1,000 mg Oral QHS Peterson Ao, MD   1,000 mg at 08/12/23 2007   haloperidol (HALDOL) tablet 10 mg  10 mg Oral BID Davielle Lingelbach H, NP       lactulose (CHRONULAC) 10 GM/15ML solution 10 g  10 g Oral BID Massengill, Harrold Donath, MD   10 g at 08/10/23 0900   magnesium hydroxide (MILK OF MAGNESIA) suspension 30 mL  30 mL Oral Daily PRN Peterson Ao, MD       nicotine (NICODERM CQ - dosed in mg/24 hours) patch 14 mg  14 mg Transdermal Daily Peterson Ao, MD   14 mg at 08/10/23 0901   traZODone (DESYREL) tablet 100 mg  100 mg Oral QHS Ovide Dusek H, NP       Lab Results:  No results found for this or any previous visit (from the past 48 hours).   Blood Alcohol level:  Lab Results  Component Value Date   ETH <10 08/01/2023    Metabolic Disorder Labs: Lab Results  Component Value Date   HGBA1C 5.7 (H) 08/07/2023   MPG 116.89 08/07/2023   MPG 119.76 08/06/2023   No results found for: "PROLACTIN" Lab Results  Component  Value Date   CHOL 159 08/06/2023   TRIG 92 08/06/2023   HDL 47 08/06/2023   CHOLHDL 3.4 08/06/2023   VLDL 18 08/06/2023   LDLCALC 94 08/06/2023    Physical Findings: AIMS: Facial and Oral Movements Muscles of Facial Expression: None Lips and Perioral Area: None Jaw: None Tongue: None,Extremity Movements Upper (arms, wrists, hands, fingers): None Lower (legs, knees, ankles, toes): None, Trunk Movements Neck, shoulders, hips: None, Global Judgements Severity of abnormal movements overall : None Incapacitation due to abnormal movements: None Patient's awareness of abnormal movements: No Awareness, Dental Status Current problems with teeth and/or dentures?: No Does patient usually wear dentures?: No Edentia?: No  CIWA:  N/A  COWS:   N/A  Musculoskeletal: Strength & Muscle Tone: within normal limits Gait & Station: normal Patient leans: N/A  Psychiatric Specialty Exam:  Presentation  General Appearance:  Casual; Fairly Groomed  Eye Contact: Fleeting  Speech: Blocked  Speech Volume: Normal  Handedness: -- (Did not assess)  Mood and Affect  Mood: Dysphoric; Anxious  Affect: Blunt  Thought Process  Thought Processes: Less disorganized  Descriptions of Associations: More linear, but tangential at times  Orientation:Full (Time, Place and Person) (Disoriented to situation)  Thought Content: Confused, at times illogical  History of Schizophrenia/Schizoaffective disorder:Yes  Duration of Psychotic Symptoms:Greater than six months  Hallucinations:Hallucinations: Auditory (Appears to be responding to internal stimuli)   Ideas of Reference: Delusions?  Suicidal Thoughts:Suicidal Thoughts: No   Homicidal Thoughts:Homicidal Thoughts: No   Sensorium  Memory: Immediate Fair; Recent Fair  Judgment: Impaired  Insight: Poor  Executive Functions  Concentration: Fair  Attention Span: Fair  Recall: [employer] of  Knowledge: Fair  Language: Fair  Psychomotor Activity  Psychomotor Activity: Psychomotor Activity: Normal   Assets  Assets: Housing; Social Support  Sleep  Sleep: Sleep: Fair   Physical Exam: Physical Exam Vitals and nursing note reviewed.  Constitutional:      General: He is not in acute distress.    Appearance: He is normal weight. He is not toxic-appearing.  HENT:     Head: Normocephalic.     Nose: Nose normal.  Mouth/Throat:     Pharynx: Oropharynx is clear.  Eyes:     Conjunctiva/sclera: Conjunctivae normal.  Cardiovascular:     Pulses: Normal pulses.  Pulmonary:     Effort: No respiratory distress.  Abdominal:     Comments: Deferred  Genitourinary:    Comments: Deferred Musculoskeletal:        General: Normal range of motion.     Cervical back: Normal range of motion.  Skin:    General: Skin is dry.  Neurological:     Mental Status: He is alert. He is disoriented.     Motor: No weakness.     Coordination: Coordination normal.    Review of Systems  Constitutional:  Negative for chills and fever.  HENT:  Negative for congestion, hearing loss and sore throat.   Eyes:  Negative for discharge and redness.  Respiratory:  Negative for cough, sputum production, shortness of breath and wheezing.   Cardiovascular:  Negative for chest pain and palpitations.  Gastrointestinal:  Negative for constipation, diarrhea, nausea and vomiting.  Genitourinary:  Negative for dysuria.  Musculoskeletal:  Negative for falls.  Neurological:  Negative for dizziness, tingling, tremors and headaches.  Endo/Heme/Allergies:        See allergy listing  Psychiatric/Behavioral:  Negative for depression, hallucinations, memory loss, substance abuse and suicidal ideas. The patient is nervous/anxious. The patient does not have insomnia.        +Delusional  All other systems reviewed and are negative.  Blood pressure 114/73, pulse 85, temperature 97.8 F (36.6 C), temperature  source Oral, resp. rate 16, height 5\' 10"  (1.778 m), weight 58.3 kg, SpO2 100%. Body mass index is 18.45 kg/m.   Treatment Plan Summary: Daily contact with patient to assess and evaluate symptoms and progress in treatment and Medication management  Assessment: Principal/active diagnoses: Schizophrenia, paranoid type  Plan: The risks/benefits/side-effects/alternatives to the medications in use were discussed in detail with the patient and time was given for patient's questions. The patient consents to medication trial.     Safety and Monitoring: Involuntary admission to inpatient psychiatric unit for safety, stabilization and treatment Daily contact with patient to assess and evaluate symptoms and progress in treatment Patient's case to be discussed in multi-disciplinary team meeting Observation Level : q15 minute checks Vital signs: q12 hours Precautions: Safety   Medication plan: -Discontinued olanzapine 20 mg po due to ineffectiveness (home medication) 1/22.  -Titrate Haldol from 5 mg twice daily to 10 mg twice daily for psychosis and mood stabilization. -Continue Cogentin 0.5 mg twice daily  -Continue Depakote ER 1,000 mg po Q bedtime for mood stabilization (home med).  1-19 VA level: 72 NH3: 50 -continue lactulose 10g bid  LFTs wnl 08/10/2023: Ammonia level ordered, liver function tests ordered  -Continue Albuterol solution 2.5 mg Q 6 hours prn for SOB.  -Continue Nicotine patch 14 mg trans-dermally Q 24 hours for nicotine withdrawal.   CK has down trended to normal range    Agitation protocol: ---Haldol 5 mg, oral, 3 times daily as needed, mild agitation --Benadryl 50 mg, oral, 3 times daily as needed, mild agitation      OR                                --Haldol injection 5 mg, IM, 3 times daily as needed, moderate agitation --Benadryl injection 50 mg, IM, 3 times daily as needed, moderate agitation --Ativan injection 2 mg, IM, 3 times  daily as needed, moderate  agitation         OR                             --Haldol injection 5 mg, IM, 3 times daily as needed, severe agitation --Benadryl injection 50 mg, IM, 3 times daily as needed, severe agitation --Ativan injection 2 mg, IM, 3 times daily as needed, severe agitation   Other PRNS -Continue Tylenol 650 mg every 6 hours PRN for mild pain -Continue Maalox 30 ml Q 4 hrs PRN for indigestion -Continue MOM 30 ml po Q 6 hrs for constipation  Discharge Planning: Social work and case management to assist with discharge planning and identification of hospital follow-up needs prior to discharge Estimated LOS: 3-4 more days if approaching baseline  Discharge Concerns: Need to establish a safety plan; Medication compliance and effectiveness Discharge Goals: Return home with outpatient referrals for mental health follow-up including medication management/psychotherapy  Norma Fredrickson, NP 08/13/2023, 4:33 PM  Patient ID: Cesar Robinson, male   DOB: 1981-06-16, 43 y.o.   MRN: [id number] Patient ID: Cesar Robinson, male   DOB: 07-18-1981, 43 y.o.   MRN: [id number] Patient ID: Cesar Robinson, male   DOB: 1980-10-19, 43 y.o.   MRN: [id number] Patient ID: Cesar Robinson, male   DOB: [birth year], 43 y.o.   MRN: [id number]

## 2023-08-13 NOTE — Progress Notes (Signed)
   08/13/23 1400  Psych Admission Type (Psych Patients Only)  Admission Status Involuntary  Psychosocial Assessment  Patient Complaints None  Eye Contact Fair  Facial Expression Flat  Affect Irritable  Speech Logical/coherent  Interaction Isolative  Motor Activity Slow  Appearance/Hygiene In scrubs  Behavior Characteristics Cooperative  Mood Anxious  Thought Process  Coherency Disorganized  Content Delusions  Delusions Paranoid  Perception WDL  Hallucination None reported or observed  Judgment Impaired  Confusion UTA  Danger to Self  Current suicidal ideation? Denies  Danger to Others  Danger to Others None reported or observed   Pt isolative in room. Pt did go eat in cafeteria but has not attended groups. Pt refused lactulose.

## 2023-08-13 NOTE — Group Note (Signed)
Date:  08/13/2023 Time:  4:49 PM  Group Topic/Focus:  Social Wellness (Self and Interpersonal)    Participation Level:  Did Not Attend  Participation Quality:   n/a  Affect:   n/a  Cognitive:   n/a  Insight: None  Engagement in Group:   n/a  Modes of Intervention:   n/a  Additional Comments:   Pt did not attend.  Edmund Hilda Ayda Tancredi 08/13/2023, 4:49 PM

## 2023-08-13 NOTE — Progress Notes (Signed)

## 2023-08-14 DIAGNOSIS — F200 Paranoid schizophrenia: Secondary | ICD-10-CM | POA: Diagnosis not present

## 2023-08-14 NOTE — Progress Notes (Signed)
   08/14/23 2222  Psych Admission Type (Psych Patients Only)  Admission Status Involuntary  Psychosocial Assessment  Patient Complaints Anxiety;Disorientation;Sleep disturbance;Suspiciousness  Eye Contact Fair  Facial Expression Wide-eyed  Affect Preoccupied  Speech Pressured  Interaction Guarded;Forwards little;Cautious;Hypervigilant  Motor Activity Restless  Appearance/Hygiene In scrubs  Behavior Characteristics Elopement risk;Anxious;Guarded;Restless  Mood Anxious;Suspicious;Preoccupied  Thought Process  Coherency Disorganized  Content Paranoia;Preoccupation;Delusions  Delusions Paranoid  Perception Derealization  Hallucination None reported or observed  Judgment Impaired  Confusion Mild  Danger to Self  Current suicidal ideation? Denies  Agreement Not to Harm Self Yes  Description of Agreement verbal  Danger to Others  Danger to Others None reported or observed

## 2023-08-14 NOTE — Plan of Care (Signed)
Problem: Education: Goal: Verbalization of understanding the information provided will improve Outcome: Progressing   Problem: Activity: Goal: Interest or engagement in activities will improve Outcome: Not Progressing

## 2023-08-14 NOTE — Progress Notes (Signed)
   08/14/23 0528  15 Minute Checks  Location Bedroom  Visual Appearance Calm  Behavior Sleeping  Sleep (Behavioral Health Patients Only)  Calculate sleep? (Click Yes once per 24 hr at 0600 safety check) Yes  Documented sleep last 24 hours 7.25

## 2023-08-14 NOTE — Progress Notes (Signed)
Pt in room with all lights on, appears watchful.  Pt did appear to take his night medications.  Pt forwards little, answers in short yes, no, okay responses.

## 2023-08-14 NOTE — Progress Notes (Addendum)
Pt appears much more confused tonight.  Pt repeatedly asks to be let out of door, carrying his belongings with him in hall.  When asked about this Pt states "I'm getting ready to leave".  Explained to Pt that he is scheduled for lab work in AM and the doctor must review it.  Explained that only doctor can make decision on whether he is safe to leave.  It was explained to Pt that his safety is our biggest concern.  Pt verbalized understanding but appeared very watchful.  Pt given Depakote as ordered but refused Haldol and cogentin at this time.  Pt states he will consider taking those closer to bedtime.  Pt encouraged to take medications and then speak to doctor about his concerns in AM.  Pt appears extremely paranoid.  Pt stated "that's reasonable".  Will try to give Pt the medication again at bedtime.  Pt is so far redirectable.

## 2023-08-14 NOTE — Progress Notes (Signed)
   08/14/23 1400  Psych Admission Type (Psych Patients Only)  Admission Status Involuntary  Psychosocial Assessment  Patient Complaints None  Eye Contact Fair  Facial Expression Animated  Affect Preoccupied  Speech Logical/coherent  Interaction Cautious  Motor Activity Slow  Appearance/Hygiene In scrubs  Behavior Characteristics Appropriate to situation  Mood Anxious  Thought Process  Coherency Disorganized  Content Delusions  Delusions Paranoid  Perception WDL  Hallucination None reported or observed  Judgment Impaired  Confusion None  Danger to Self  Current suicidal ideation? Denies  Agreement Not to Harm Self Yes  Description of Agreement verbal  Danger to Others  Danger to Others None reported or observed   Pt visible out of room and standing at nurses station.  Pt alert to person and place  but unsure of date or situation.   Pt asked to be discharged. He cont to display paranoid behavior (looking behind hm, checking doors)   pt was informed that his is still under IVC and the Doctor will reeval him in the AM.  Pt verbalized understanding.  Denies SI, HI when asked.  Also Denies AVH.

## 2023-08-14 NOTE — Group Note (Signed)
Date:  08/14/2023 Time:  1:56 PM  Group Topic/Focus:  Dimensions of Wellness:   The focus of this group is to introduce the topic of wellness and discuss the role each dimension of wellness plays in total health.   Participation Level:  Did Not Attend  Participation Quality:   n/a  Affect:   n/a  Cognitive:   n/a  Insight: None  Engagement in Group:   n/a  Modes of Intervention:   n/a  Additional Comments:   Pt did not attend.   Edmund Hilda Seleen Walter 08/14/2023, 1:56 PM

## 2023-08-14 NOTE — Plan of Care (Signed)
Problem: Education: Goal: Knowledge of [facility] Education information/materials will improve Outcome: Progressing Goal: Emotional status will improve Outcome: Progressing Goal: Mental status will improve Outcome: Progressing Goal: Verbalization of understanding the information provided will improve Outcome: Progressing   Problem: Activity: Goal: Interest or engagement in activities will improve Outcome: Progressing Goal: Sleeping patterns will improve Outcome: Progressing   Problem: Coping: Goal: Ability to verbalize frustrations and anger appropriately will improve Outcome: Progressing Goal: Ability to demonstrate self-control will improve Outcome: Progressing   Problem: Health Behavior/Discharge Planning: Goal: Identification of resources available to assist in meeting health care needs will improve Outcome: Progressing Goal: Compliance with treatment plan for underlying cause of condition will improve Outcome: Progressing   Problem: Physical Regulation: Goal: Ability to maintain clinical measurements within normal limits will improve Outcome: Progressing   Problem: Safety: Goal: Periods of time without injury will increase Outcome: Progressing   Problem: Education: Goal: Knowledge of [facility] Education information/materials will improve Outcome: Progressing Goal: Emotional status will improve Outcome: Progressing Goal: Mental status will improve Outcome: Progressing Goal: Verbalization of understanding the information provided will improve Outcome: Progressing   Problem: Activity: Goal: Interest or engagement in activities will improve Outcome: Progressing Goal: Sleeping patterns will improve Outcome: Progressing   Problem: Coping: Goal: Ability to verbalize frustrations and anger appropriately will improve Outcome: Progressing Goal: Ability to demonstrate self-control will improve Outcome: Progressing   Problem: Health  Behavior/Discharge Planning: Goal: Identification of resources available to assist in meeting health care needs will improve Outcome: Progressing Goal: Compliance with treatment plan for underlying cause of condition will improve Outcome: Progressing   Problem: Physical Regulation: Goal: Ability to maintain clinical measurements within normal limits will improve Outcome: Progressing   Problem: Safety: Goal: Periods of time without injury will increase Outcome: Progressing   Problem: Activity: Goal: Will verbalize the importance of balancing activity with adequate rest periods Outcome: Progressing   Problem: Education: Goal: Will be free of psychotic symptoms Outcome: Progressing Goal: Knowledge of the prescribed therapeutic regimen will improve Outcome: Progressing   Problem: Coping: Goal: Coping ability will improve Outcome: Progressing Goal: Will verbalize feelings Outcome: Progressing   Problem: Health Behavior/Discharge Planning: Goal: Compliance with prescribed medication regimen will improve Outcome: Progressing   Problem: Nutritional: Goal: Ability to achieve adequate nutritional intake will improve Outcome: Progressing   Problem: Role Relationship: Goal: Ability to communicate needs accurately will improve Outcome: Progressing Goal: Ability to interact with others will improve Outcome: Progressing   Problem: Safety: Goal: Ability to redirect hostility and anger into socially appropriate behaviors will improve Outcome: Progressing Goal: Ability to remain free from injury will improve Outcome: Progressing   Problem: Self-Care: Goal: Ability to participate in self-care as condition permits will improve Outcome: Progressing   Problem: Self-Concept: Goal: Will verbalize positive feelings about self Outcome: Progressing

## 2023-08-14 NOTE — Progress Notes (Signed)
[facility] MD Progress Note  08/14/2023 8:42 AM Cesar Robinson  MRN:  [id number]  HPI: Per chart review:  The patient is a 43 year old male with a history of schizophrenia presents to the emergency department escorted by [employer]. Much of the history is provided by police secondary to acute psychotic episode. Per [employer], patient's brother notes noncompliance with psychiatric medications for an unknown length of time. The patient left his home today and was found standing outside in the lawn of an unknown home. Police tried to transport the patient back to his residence where the patient did not recognize his twin brother and said that his brother tried to kill his mother. The patient has been exceedingly paranoid with police. Not making any suicidal or homicidal remarks. Challenging to redirect, but nonviolent.   The patient's chart and nursing notes were reviewed. The patient's case was discussed in multidisciplinary team meeting. Vital signs within defined limits. Patient is compliant with routine medication regimen without difficulty. Sleep hours last night: 7.25 hours, as documented in the nursing flow sheets. No behavioral episodes reported at this time. No PRN medication was required, according to the nursing record  Nursing reports patient continues to exhibit paranoia, however he is coming out of his room for his medication. Staff further reports patient is refusing lactulose.  Patient assessment 1/25:  The patient was seen face-to-face by this provider and  During today's encounter, the patient was observed lying in bed awake and described his mood as a "B," clarifying that this means "alright."  He denies experiencing auditory or visual hallucinations, paranoia, or delusional thoughts.  He reported no difficulty sleeping last night and described his appetite as satisfactory, noting that he ate breakfast and a caf this morning.  When asked about his understanding of being in a Robinson, he responded, "I  guess my medicine," though he is unsure of the events prior to hospitalization.  He reported attending an AA group yesterday, stating that he does not drink but wanted to learn something different. The patient denies any side effects attributed to his psychotropic medication.  Patient is alert and oriented to person, place, and time. Review of nursing documentation does not indicate that he attended any groups yesterday; however, documentation show that on January 23, the patient joined the wrap-up group towards the end. The patient continues to make progress, demonstrating increased alertness and focus. He did not appear to respond to internal or external stimuli, nor did he display inappropriate laughter during the assessment today. We will continue current treatment plan and monitor for further improvements.   Case staffed with attending psychiatrist, N. Pashayn, and it was determined that the plan is to discontinue lactulose for now.  Recheck ammonia level tomorrow morning; if stable, consider discontinuing lactulose permanently.   Changes made yesterday to medication regimen: -Titrate Haldol from 5 mg twice daily to 10 mg twice daily for psychosis and mood stabilization.   Continued hospitalization remains necessary at this time, to treat and stabilize mental status prior to discharge.    Principal Problem: Schizophrenia (HCC) Diagnosis: Principal Problem:   Schizophrenia (HCC)  Total Time spent with patient: 30 minutes  Past Psychiatric History:  Per H&P, patient previously diagnosed with schizophrenia and was taking Zyprexa and Depakote as outpatient.  He has 1 previous hospitalization but has been apparently stable for about 10 years.  Past Medical History:  Past Medical History:  Diagnosis Date   Schizophrenia (HCC)    Sleep apnea    History reviewed. No  pertinent surgical history. Family History: History reviewed. No pertinent family history. Family Psychiatric  History: See  H&P Social History:  Social History   Substance and Sexual Activity  Alcohol Use Not Currently   Comment: occ     Social History   Substance and Sexual Activity  Drug Use Yes   Types: Marijuana   Comment: Uses "a couple weekends"    Social History   Socioeconomic History   Marital status: Single    Spouse name: Not on file   Number of children: Not on file   Years of education: Not on file   Highest education level: Not on file  Occupational History   Occupation: unemployed  Tobacco Use   Smoking status: Every Day    Current packs/day: 0.25    Types: Cigarettes   Smokeless tobacco: Not on file   Tobacco comments:    Black and Mild, declines cessation  Vaping Use   Vaping status: Never Used  Substance and Sexual Activity   Alcohol use: Not Currently    Comment: occ   Drug use: Yes    Types: Marijuana    Comment: Uses "a couple weekends"   Sexual activity: Yes  Other Topics Concern   Not on file  Social History Narrative   Not on file   Social Drivers of Health   Financial Resource Strain: Not on file  Food Insecurity: Food Insecurity Present (08/05/2023)   Hunger Vital Sign    Worried About Running Out of Food in the Last Year: Patient unable to answer    Ran Out of Food in the Last Year: Sometimes true  Transportation Needs: Patient Unable To Answer (08/05/2023)   PRAPARE - Administrator, Civil Service (Medical): Patient unable to answer    Lack of Transportation (Non-Medical): Patient unable to answer  Physical Activity: Not on file  Stress: Not on file  Social Connections: Not on file   Additional Social History:    Current Medications: Current Facility-Administered Medications  Medication Dose Route Frequency Provider Last Rate Last Admin   acetaminophen (TYLENOL) tablet 650 mg  650 mg Oral Q6H PRN Peterson Ao, MD       albuterol (PROVENTIL) (2.5 MG/3ML) 0.083% nebulizer solution 2.5 mg  2.5 mg Nebulization Q6H PRN Peterson Ao,  MD       alum & mag hydroxide-simeth (MAALOX/MYLANTA) 200-200-20 MG/5ML suspension 30 mL  30 mL Oral Q4H PRN Peterson Ao, MD       benztropine (COGENTIN) tablet 0.5 mg  0.5 mg Oral BID Britten Parady H, NP   0.5 mg at 08/14/23 1610   haloperidol (HALDOL) tablet 5 mg  5 mg Oral TID PRN Peterson Ao, MD       And   diphenhydrAMINE (BENADRYL) capsule 50 mg  50 mg Oral TID PRN Peterson Ao, MD       haloperidol lactate (HALDOL) injection 5 mg  5 mg Intramuscular TID PRN Massengill, Harrold Donath, MD       And   diphenhydrAMINE (BENADRYL) injection 50 mg  50 mg Intramuscular TID PRN Massengill, Harrold Donath, MD       And   LORazepam (ATIVAN) injection 2 mg  2 mg Intramuscular TID PRN Massengill, Harrold Donath, MD       haloperidol lactate (HALDOL) injection 10 mg  10 mg Intramuscular TID PRN Massengill, Harrold Donath, MD       And   diphenhydrAMINE (BENADRYL) injection 50 mg  50 mg Intramuscular TID PRN Phineas Inches, MD  And   LORazepam (ATIVAN) injection 2 mg  2 mg Intramuscular TID PRN Phineas Inches, MD       divalproex (DEPAKOTE ER) 24 hr tablet 1,000 mg  1,000 mg Oral QHS Peterson Ao, MD   1,000 mg at 08/13/23 2115   haloperidol (HALDOL) tablet 10 mg  10 mg Oral BID Rhen Dossantos H, NP   10 mg at 08/14/23 0835   lactulose (CHRONULAC) 10 GM/15ML solution 10 g  10 g Oral BID Massengill, Harrold Donath, MD   10 g at 08/10/23 0900   magnesium hydroxide (MILK OF MAGNESIA) suspension 30 mL  30 mL Oral Daily PRN Peterson Ao, MD       nicotine (NICODERM CQ - dosed in mg/24 hours) patch 14 mg  14 mg Transdermal Daily Peterson Ao, MD   14 mg at 08/14/23 0836   traZODone (DESYREL) tablet 100 mg  100 mg Oral QHS Mary Secord H, NP   100 mg at 08/13/23 2115   Lab Results:  No results found for this or any previous visit (from the past 48 hours).   Blood Alcohol level:  Lab Results  Component Value Date   ETH <10 08/01/2023    Metabolic Disorder Labs: Lab Results  Component Value  Date   HGBA1C 5.7 (H) 08/07/2023   MPG 116.89 08/07/2023   MPG 119.76 08/06/2023   No results found for: "PROLACTIN" Lab Results  Component Value Date   CHOL 159 08/06/2023   TRIG 92 08/06/2023   HDL 47 08/06/2023   CHOLHDL 3.4 08/06/2023   VLDL 18 08/06/2023   LDLCALC 94 08/06/2023    Physical Findings: AIMS: Facial and Oral Movements Muscles of Facial Expression: None Lips and Perioral Area: None Jaw: None Tongue: None,Extremity Movements Upper (arms, wrists, hands, fingers): None Lower (legs, knees, ankles, toes): None, Trunk Movements Neck, shoulders, hips: None, Global Judgements Severity of abnormal movements overall : None Incapacitation due to abnormal movements: None Patient's awareness of abnormal movements: No Awareness, Dental Status Current problems with teeth and/or dentures?: No Does patient usually wear dentures?: No Edentia?: No  CIWA:  N/A  COWS:   N/A  Musculoskeletal: Strength & Muscle Tone: within normal limits Gait & Station: normal Patient leans: N/A  Psychiatric Specialty Exam:  Presentation  General Appearance:  Casual; Fairly Groomed  Eye Contact: Fleeting  Speech: Blocked  Speech Volume: Normal  Handedness: -- (Did not assess)  Mood and Affect  Mood: Dysphoric; Anxious  Affect: Blunt  Thought Process  Thought Processes: Less disorganized  Descriptions of Associations: More linear, but tangential at times  Orientation:Full (Time, Place and Person) (Disoriented to situation)  Thought Content: Confused, at times illogical  History of Schizophrenia/Schizoaffective disorder:Yes  Duration of Psychotic Symptoms:Greater than six months  Hallucinations:Hallucinations: Auditory (Appears to be responding to internal stimuli)   Ideas of Reference: Delusions?  Suicidal Thoughts:Suicidal Thoughts: No   Homicidal Thoughts:Homicidal Thoughts: No   Sensorium  Memory: Immediate Fair; Recent  Fair  Judgment: Impaired  Insight: Poor  Executive Functions  Concentration: Fair  Attention Span: Fair  Recall: [employer] of Knowledge: Fair  Language: Fair  Psychomotor Activity  Psychomotor Activity: Psychomotor Activity: Normal   Assets  Assets: Housing; Social Support  Sleep  Sleep: Sleep: Fair   Physical Exam: Physical Exam Vitals and nursing note reviewed.  Constitutional:      General: He is not in acute distress.    Appearance: He is normal weight. He is not toxic-appearing.  HENT:  Head: Normocephalic.     Nose: Nose normal.     Mouth/Throat:     Pharynx: Oropharynx is clear.  Eyes:     Conjunctiva/sclera: Conjunctivae normal.  Cardiovascular:     Pulses: Normal pulses.  Pulmonary:     Effort: No respiratory distress.  Abdominal:     Comments: Deferred  Genitourinary:    Comments: Deferred Musculoskeletal:        General: Normal range of motion.     Cervical back: Normal range of motion.  Skin:    General: Skin is dry.  Neurological:     Mental Status: He is alert. He is disoriented.     Motor: No weakness.     Coordination: Coordination normal.    Review of Systems  Constitutional:  Negative for chills and fever.  HENT:  Negative for congestion, hearing loss and sore throat.   Eyes:  Negative for discharge and redness.  Respiratory:  Negative for cough, sputum production, shortness of breath and wheezing.   Cardiovascular:  Negative for chest pain and palpitations.  Gastrointestinal:  Negative for constipation, diarrhea, nausea and vomiting.  Genitourinary:  Negative for dysuria.  Musculoskeletal:  Negative for falls.  Neurological:  Negative for dizziness, tingling, tremors and headaches.  Endo/Heme/Allergies:        See allergy listing  Psychiatric/Behavioral:  Negative for depression, hallucinations, memory loss, substance abuse and suicidal ideas. The patient is nervous/anxious. The patient does not have insomnia.         +Delusional  All other systems reviewed and are negative.  Blood pressure 112/77, pulse 99, temperature 99.2 F (37.3 C), temperature source Oral, resp. rate 18, height 5\' 10"  (1.778 m), weight 58.3 kg, SpO2 99%. Body mass index is 18.45 kg/m.   Treatment Plan Summary: Daily contact with patient to assess and evaluate symptoms and progress in treatment and Medication management  Assessment: Principal/active diagnoses: Schizophrenia, paranoid type  Plan: The risks/benefits/side-effects/alternatives to the medications in use were discussed in detail with the patient and time was given for patient's questions. The patient consents to medication trial.     Safety and Monitoring: Involuntary admission to inpatient psychiatric unit for safety, stabilization and treatment Daily contact with patient to assess and evaluate symptoms and progress in treatment Patient's case to be discussed in multi-disciplinary team meeting Observation Level : q15 minute checks Vital signs: q12 hours Precautions: Safety   Medication plan: -Discontinued olanzapine 20 mg po due to ineffectiveness (home medication) 1/22.  -Continue Haldol 10 mg twice daily for psychosis and mood stabilization. -Continue Cogentin 0.5 mg twice daily  -Continue Depakote ER 1,000 mg po Q bedtime for mood stabilization (home med).  1-19 VA level: 72 1/21: Ammonia 34  -Will discontinue lactulose 10 g twice daily and recheck ammonia level tomorrow morning.   LFTs wnl 08/10/2023: Ammonia level ordered, liver function tests ordered 08/14/2023: Ammonia level ordered, liver function tests ordered for collection on 1/26.  -Continue Albuterol solution 2.5 mg Q 6 hours prn for SOB.  -Continue Nicotine patch 14 mg trans-dermally Q 24 hours for nicotine withdrawal.   CK has down trended to normal range    Agitation protocol: ---Haldol 5 mg, oral, 3 times daily as needed, mild agitation --Benadryl 50 mg, oral, 3 times daily as  needed, mild agitation      OR                                --  Haldol injection 5 mg, IM, 3 times daily as needed, moderate agitation --Benadryl injection 50 mg, IM, 3 times daily as needed, moderate agitation --Ativan injection 2 mg, IM, 3 times daily as needed, moderate agitation         OR                             --Haldol injection 5 mg, IM, 3 times daily as needed, severe agitation --Benadryl injection 50 mg, IM, 3 times daily as needed, severe agitation --Ativan injection 2 mg, IM, 3 times daily as needed, severe agitation   Other PRNS -Continue Tylenol 650 mg every 6 hours PRN for mild pain -Continue Maalox 30 ml Q 4 hrs PRN for indigestion -Continue MOM 30 ml po Q 6 hrs for constipation  Discharge Planning: Social work and case management to assist with discharge planning and identification of Robinson follow-up needs prior to discharge Estimated LOS: 3-4 more days if approaching baseline  Discharge Concerns: Need to establish a safety plan; Medication compliance and effectiveness Discharge Goals: Return home with outpatient referrals for mental health follow-up including medication management/psychotherapy  Norma Fredrickson, NP 08/14/2023, 8:42 AM  Patient ID: Cesar Robinson, male   DOB: 08-Jan-1981, 43 y.o.   MRN: [id number] Patient ID: THI SISEMORE, male   DOB: 02-May-1981, 43 y.o.   MRN: [id number] Patient ID: AXELL TRIGUEROS, male   DOB: 10-Sep-1980, 43 y.o.   MRN: [id number] Patient ID: LEWIE DEMAN, male   DOB: 1981/05/12, 43 y.o.   MRN: [id number] Patient ID: KAYCEON OKI, male   DOB: Sep 04, 1980, 43 y.o.   MRN: [id number]

## 2023-08-14 NOTE — Progress Notes (Signed)
Pt did not attend group.

## 2023-08-14 NOTE — Group Note (Signed)
Date:  08/14/2023 Time:  1:02 PM  Group Topic/Focus:  Goals Group:   The focus of this group is to help patients establish daily goals to achieve during treatment and discuss how the patient can incorporate goal setting into their daily lives to aide in recovery. Orientation:   The focus of this group is to educate the patient on the purpose and policies of crisis stabilization and provide a format to answer questions about their admission.  The group details unit policies and expectations of patients while admitted.    Participation Level:  Did Not Attend  Participation Quality:   n/a  Affect:   n/a  Cognitive:   n/a  Insight: None  Engagement in Group:   n/a  Modes of Intervention:   n/a  Additional Comments:   Pt did not attend.  Cesar Robinson 08/14/2023, 1:02 PM

## 2023-08-15 DIAGNOSIS — F200 Paranoid schizophrenia: Secondary | ICD-10-CM | POA: Diagnosis not present

## 2023-08-15 LAB — AMMONIA: Ammonia: 25 umol/L (ref 9–35)

## 2023-08-15 NOTE — Progress Notes (Signed)
Pt would like to speak to doctor, has been hyper-focused on discharge all night.  Pt did sleep 6.25 hours.

## 2023-08-15 NOTE — Plan of Care (Signed)
  Problem: Education: Goal: Emotional status will improve Outcome: Progressing Goal: Verbalization of understanding the information provided will improve Outcome: Progressing   Problem: Activity: Goal: Sleeping patterns will improve Outcome: Progressing   Problem: Coping: Goal: Ability to verbalize frustrations and anger appropriately will improve Outcome: Progressing Goal: Ability to demonstrate self-control will improve Outcome: Progressing   Problem: Health Behavior/Discharge Planning: Goal: Compliance with treatment plan for underlying cause of condition will improve Outcome: Progressing   Problem: Physical Regulation: Goal: Ability to maintain clinical measurements within normal limits will improve Outcome: Progressing   Problem: Safety: Goal: Periods of time without injury will increase Outcome: Progressing

## 2023-08-15 NOTE — Progress Notes (Signed)
Patient is very preoccupied saying anything he can think of to get out here. He's saying there's been a death in the family and his brother is in the parking lot. This RN told him I can't discharge him without a discharge order from the provider. He said he had a discharge order this morning which he did not. Patient increasingly irritable. He asks "did I cuss you out? Because I thought I did." PRN 5mg  PO haldol and PRN 50mg  PO benadryl administered for irritability.

## 2023-08-15 NOTE — Progress Notes (Signed)
   08/15/23 0532  15 Minute Checks  Location Bedroom  Visual Appearance Calm  Behavior Sleeping  Sleep (Behavioral Health Patients Only)  Calculate sleep? (Click Yes once per 24 hr at 0600 safety check) Yes  Documented sleep last 24 hours 6.25

## 2023-08-15 NOTE — Group Note (Signed)
Date:  08/15/2023 Time:  8:55 PM  Group Topic/Focus:  Wrap-Up Group:   The focus of this group is to help patients review their daily goal of treatment and discuss progress on daily workbooks.    Participation Level:  Active  Participation Quality:  Appropriate  Affect:  Appropriate  Cognitive:  Appropriate  Insight: Appropriate  Engagement in Group:  Engaged  Modes of Intervention:  Education and Exploration  Additional Comments:  Patient attended and participated in group tonight.  He reports that he like to train dogs  Cesar Robinson 08/15/2023, 8:55 PM

## 2023-08-15 NOTE — Progress Notes (Signed)
   08/15/23 2030  Psych Admission Type (Psych Patients Only)  Admission Status Involuntary  Psychosocial Assessment  Patient Complaints None  Eye Contact Fair  Facial Expression Anxious  Affect Preoccupied  Speech Logical/coherent  Interaction Cautious;Minimal  Motor Activity Restless  Appearance/Hygiene In scrubs  Behavior Characteristics Anxious;Cooperative  Mood Suspicious;Anxious;Preoccupied  Aggressive Behavior  Effect No apparent injury  Thought Process  Coherency Disorganized;Circumstantial  Content Preoccupation;Paranoia  Delusions Paranoid  Perception WDL  Hallucination None reported or observed  Judgment Impaired  Confusion Mild  Danger to Self  Current suicidal ideation? Denies

## 2023-08-15 NOTE — Plan of Care (Signed)
Problem: Education: Goal: Verbalization of understanding the information provided will improve Outcome: Progressing   Problem: Education: Goal: Mental status will improve Outcome: Not Progressing

## 2023-08-15 NOTE — Progress Notes (Signed)
Pt coming out his room multiple times stating he was leaving tonight "AJ told me to tell you to call him because I am supposed to leave tonight " pt was informed that the doctor was the only one that could D/C patient, pt needing frequent redirection

## 2023-08-15 NOTE — Progress Notes (Signed)
   08/15/23 1000  Psych Admission Type (Psych Patients Only)  Admission Status Involuntary  Psychosocial Assessment  Patient Complaints None  Eye Contact Fair  Facial Expression Anxious  Affect Preoccupied  Speech Logical/coherent;Pressured  Interaction Forwards little;Cautious  Motor Activity Fidgety  Appearance/Hygiene Unremarkable  Behavior Characteristics Anxious;Cooperative  Mood Anxious;Pleasant  Thought Process  Coherency Disorganized  Content Paranoia;Preoccupation  Delusions Paranoid  Perception WDL  Hallucination None reported or observed  Judgment Impaired  Confusion Mild  Danger to Self  Current suicidal ideation? Denies  Danger to Others  Danger to Others None reported or observed

## 2023-08-15 NOTE — Progress Notes (Signed)
Pt continues to come out his room wanting to leave, pt appers to no be able to process the information given to pt, pt was given PRN confusion protocol (5 mg Haldol and 50 mg Benadryl) per [facility]

## 2023-08-15 NOTE — Progress Notes (Signed)
D. Pt presents anxious- preoccupied, restless,but cooperative. Pt denied SI/HI and AVH . Pt compliant with medications, and was agreeable to move to [address] .  A. Labs and vitals monitored. Pt given and educated on medications. Pt moved to [address] without incident. Report given to Rozell Searing, RN Pt supported emotionally and encouraged to express concerns and ask questions.   R. Pt remains safe with 15 minute checks. Will continue POC.    08/15/23 1000  Psych Admission Type (Psych Patients Only)  Admission Status Involuntary  Psychosocial Assessment  Patient Complaints None  Eye Contact Fair  Facial Expression Anxious  Affect Preoccupied  Speech Logical/coherent;Pressured  Interaction Forwards little;Cautious  Motor Activity Fidgety  Appearance/Hygiene Unremarkable  Behavior Characteristics Anxious;Cooperative  Mood Anxious;Pleasant  Thought Process  Coherency Disorganized  Content Paranoia;Preoccupation  Delusions Paranoid  Perception WDL  Hallucination None reported or observed  Judgment Impaired  Confusion Mild  Danger to Self  Current suicidal ideation? Denies  Danger to Others  Danger to Others None reported or observed
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
## 2023-08-15 NOTE — Plan of Care (Signed)
  Problem: Education: Goal: Emotional status will improve Outcome: Progressing   Problem: Activity: Goal: Interest or engagement in activities will improve Outcome: Progressing Goal: Sleeping patterns will improve Outcome: Progressing   Problem: Coping: Goal: Ability to demonstrate self-control will improve Outcome: Progressing   Problem: Safety: Goal: Periods of time without injury will increase Outcome: Progressing   Problem: Health Behavior/Discharge Planning: Goal: Compliance with prescribed medication regimen will improve Outcome: Progressing   Problem: Education: Goal: Mental status will improve Outcome: Not Progressing

## 2023-08-16 DIAGNOSIS — F200 Paranoid schizophrenia: Secondary | ICD-10-CM | POA: Diagnosis not present

## 2023-08-16 NOTE — Plan of Care (Signed)
  Problem: Education: Goal: Emotional status will improve Outcome: Progressing   Problem: Coping: Goal: Ability to demonstrate self-control will improve Outcome: Progressing   Problem: Safety: Goal: Periods of time without injury will increase Outcome: Progressing

## 2023-08-16 NOTE — Care Management Important Message (Signed)
Medicare IM printed and given to [name], LCSW to give to the patient.

## 2023-08-16 NOTE — Group Note (Signed)
Recreation Therapy Group Note   Group Topic:Coping Skills  Group Date: 08/16/2023 Start Time: 1012 End Time: 1049 Facilitators: Brody Kump-McCall, LRT,CTRS Location: [address] Dayroom   Group Topic: Coping Skills   Goal Area(s) Addresses: Patient will define what a coping skill is. Patient will create a list of healthy coping skills beginning with each letter of the alphabet. Patient will successfully identify positive coping skills they can use post d/c.  Patient will acknowledge benefit(s) of using learned coping skills post d/c.   Intervention: Worksheet   Activity: Coping A to Z. Patient asked to identify what a coping skill is and when they use them. Patients with Clinical research associate discussed healthy versus unhealthy coping skills. Next patients were given a blank worksheet titled "Coping Skills A-Z". Patients were instructed to come up with at least one positive coping skill per letter of the alphabet. Patients were given 15 minutes to brainstorm before ideas were presented to the large group. Patients and LRT debriefed on the importance of coping skill selection based on situation and back-up plans when a skill tried is not effective. At the end of group, patients were given an handout of alphabetized strategies to keep for future reference.   Education: Pharmacologist, Scientist, physiological, Discharge Planning.    Education Outcome: Acknowledges education/Verbalizes understanding/In group clarification offered/Additional education needed   Affect/Mood: Appropriate   Participation Level: Engaged   Participation Quality: Independent   Behavior: Appropriate   Speech/Thought Process: Focused   Insight: Good   Judgement: Good   Modes of Intervention: Worksheet   Patient Response to Interventions:  Engaged   Education Outcome:  In group clarification offered    Clinical Observations/Individualized Feedback: Pt was attentive and engaged. Pt was social with peers. Pt appeared to be  able to follow along with the assignment. Pt was able to identify some coping skills as cooking, feeding his dogs, cutting grass, watch Youtube, drink water and make music play list.     Plan: Continue to engage patient in RT group sessions 2-3x/week.   [name], LRT,CTRS 08/16/2023 12:42 PM

## 2023-08-16 NOTE — Progress Notes (Signed)
   08/16/23 2015  Psych Admission Type (Psych Patients Only)  Admission Status Involuntary  Psychosocial Assessment  Patient Complaints None  Eye Contact Fair  Facial Expression Anxious  Affect Preoccupied  Speech Logical/coherent  Interaction Cautious;Minimal  Motor Activity Restless  Appearance/Hygiene In scrubs  Behavior Characteristics Cooperative  Mood Suspicious;Preoccupied  Aggressive Behavior  Effect No apparent injury  Thought Process  Coherency Disorganized;Circumstantial  Content Preoccupation;Paranoia  Delusions Paranoid  Perception WDL  Hallucination None reported or observed  Judgment Impaired  Confusion Mild  Danger to Self  Current suicidal ideation? Denies

## 2023-08-16 NOTE — Progress Notes (Signed)
Pt its extremely paranoid. Pt stated he has talked with his brother and his brother is outside and wants pt to bring blankets to him. Writer encourage pt to return to this room and get some rest.

## 2023-08-16 NOTE — Group Note (Signed)
Date:  08/16/2023 Time:  9:32 PM  Group Topic/Focus:  Wrap-Up Group:   The focus of this group is to help patients review their daily goal of treatment and discuss progress on daily workbooks.    Participation Level:  Did Not Attend  Scot Dock 08/16/2023, 9:32 PM

## 2023-08-16 NOTE — Plan of Care (Signed)
Problem: Education: Goal: Emotional status will improve Outcome: Progressing Goal: Mental status will improve Outcome: Progressing

## 2023-08-16 NOTE — Progress Notes (Signed)
[facility] MD Progress Note  08/16/2023 11:36 AM Cesar Robinson  MRN:  [id number]  HPI: Per chart review:  Admission reason : the patient is a 43 year old male with a history of schizophrenia presents to the emergency department escorted by [employer]. Much of the history is provided by police secondary to acute psychotic episode. Per [employer], patient's brother notes noncompliance with psychiatric medications for an unknown length of time. The patient left his home today and was found standing outside in the lawn of an unknown home. Police tried to transport the patient back to his residence where the patient did not recognize his twin brother and said that his brother tried to kill his mother. The patient has been exceedingly paranoid with police. Not making any suicidal or homicidal remarks. Challenging to redirect, but nonviolent.  Per chart review patient is compliant with medications on the unit including Haldol 10 mg twice daily, Depakote 1000 mg at bedtime, Cogentin 0.5 mg twice daily and trazodone 100 mg at bedtime, last as needed medication for agitation was given yesterday afternoon per chart review including Haldol and Benadryl.  Upon evaluation today patient presents alert and oriented to person place time he tells me that the reason he is in the hospital "I was not taking my medicine I needed my medicine" he notes feeling better on the medication helping him with "mood" he also reports that medication helped him think straight and feel "calm" he denies side effect medications, refuses to start LAI Haldol "I like to take pills better" he reports he will comply with medications at home, reports he lived with his grandmother but she have not visited and he did not call her for the past couple of days.  He denies SI HI or AVH he denies depressed mood or anxiety at time of my evaluation.  During my evaluation he does not present disorganized or delusional and does not present responding to stimuli.  He does not appear  sleepy and does not display any sign consistent with TD or EPS. In my opinion patient continues to present improved in general regard psychosis and compliance with medications, will monitor regarding as needed medication need.  I attempted to contact patient's grandmother Glory Rosebush to see if she can communicate with him by phone or visit to assess his baseline and specify discharge date but no response.  In my opinion if continues to improve he can be discharged in the next 1 to 2 days.   Principal Problem: Schizophrenia (HCC) Diagnosis: Principal Problem:   Schizophrenia (HCC)  Total Time spent with patient: 35 minutes  Past Psychiatric History:  Per H&P, patient previously diagnosed with schizophrenia and was taking Zyprexa and Depakote as outpatient.  He has 1 previous hospitalization but has been apparently stable for about 10 years.  Past Medical History:  Past Medical History:  Diagnosis Date   Schizophrenia (HCC)    Sleep apnea    History reviewed. No pertinent surgical history. Family History: History reviewed. No pertinent family history. Family Psychiatric  History: See H&P Social History:  Social History   Substance and Sexual Activity  Alcohol Use Not Currently   Comment: occ     Social History   Substance and Sexual Activity  Drug Use Yes   Types: Marijuana   Comment: Uses "a couple weekends"    Social History   Socioeconomic History   Marital status: Single    Spouse name: Not on file   Number of children: Not on file  Years of education: Not on file   Highest education level: Not on file  Occupational History   Occupation: unemployed  Tobacco Use   Smoking status: Every Day    Current packs/day: 0.25    Types: Cigarettes   Smokeless tobacco: Not on file   Tobacco comments:    Black and Mild, declines cessation  Vaping Use   Vaping status: Never Used  Substance and Sexual Activity   Alcohol use: Not Currently    Comment: occ   Drug use: Yes     Types: Marijuana    Comment: Uses "a couple weekends"   Sexual activity: Yes  Other Topics Concern   Not on file  Social History Narrative   Not on file   Social Drivers of Health   Financial Resource Strain: Not on file  Food Insecurity: Food Insecurity Present (08/05/2023)   Hunger Vital Sign    Worried About Running Out of Food in the Last Year: Patient unable to answer    Ran Out of Food in the Last Year: Sometimes true  Transportation Needs: Patient Unable To Answer (08/05/2023)   PRAPARE - Administrator, Civil Service (Medical): Patient unable to answer    Lack of Transportation (Non-Medical): Patient unable to answer  Physical Activity: Not on file  Stress: Not on file  Social Connections: Not on file   Additional Social History:    Current Medications: Current Facility-Administered Medications  Medication Dose Route Frequency Provider Last Rate Last Admin   acetaminophen (TYLENOL) tablet 650 mg  650 mg Oral Q6H PRN Peterson Ao, MD       albuterol (PROVENTIL) (2.5 MG/3ML) 0.083% nebulizer solution 2.5 mg  2.5 mg Nebulization Q6H PRN Peterson Ao, MD       alum & mag hydroxide-simeth (MAALOX/MYLANTA) 200-200-20 MG/5ML suspension 30 mL  30 mL Oral Q4H PRN Peterson Ao, MD       benztropine (COGENTIN) tablet 0.5 mg  0.5 mg Oral BID Bennett, Christal H, NP   0.5 mg at 08/16/23 1308   haloperidol (HALDOL) tablet 5 mg  5 mg Oral TID PRN Peterson Ao, MD   5 mg at 08/15/23 2326   And   diphenhydrAMINE (BENADRYL) capsule 50 mg  50 mg Oral TID PRN Peterson Ao, MD   50 mg at 08/15/23 2326   haloperidol lactate (HALDOL) injection 5 mg  5 mg Intramuscular TID PRN Massengill, Harrold Donath, MD       And   diphenhydrAMINE (BENADRYL) injection 50 mg  50 mg Intramuscular TID PRN Massengill, Harrold Donath, MD       And   LORazepam (ATIVAN) injection 2 mg  2 mg Intramuscular TID PRN Massengill, Harrold Donath, MD       haloperidol lactate (HALDOL) injection 10 mg  10 mg  Intramuscular TID PRN Massengill, Harrold Donath, MD       And   diphenhydrAMINE (BENADRYL) injection 50 mg  50 mg Intramuscular TID PRN Massengill, Harrold Donath, MD       And   LORazepam (ATIVAN) injection 2 mg  2 mg Intramuscular TID PRN Massengill, Harrold Donath, MD       divalproex (DEPAKOTE ER) 24 hr tablet 1,000 mg  1,000 mg Oral QHS Peterson Ao, MD   1,000 mg at 08/15/23 2029   haloperidol (HALDOL) tablet 10 mg  10 mg Oral BID Bennett, Christal H, NP   10 mg at 08/16/23 0819   magnesium hydroxide (MILK OF MAGNESIA) suspension 30 mL  30 mL Oral Daily PRN Peterson Ao,  MD       nicotine (NICODERM CQ - dosed in mg/24 hours) patch 14 mg  14 mg Transdermal Daily Peterson Ao, MD   14 mg at 08/16/23 0817   traZODone (DESYREL) tablet 100 mg  100 mg Oral QHS Bennett, Christal H, NP   100 mg at 08/15/23 2030   Lab Results:  Results for orders placed or performed during the hospital encounter of 08/05/23 (from the past 48 hours)  Ammonia     Status: None   Collection Time: 08/15/23  6:34 PM  Result Value Ref Range   Ammonia 25 9 - 35 umol/L    Comment: Performed at [facility], [address] [place], [place] [address]     Blood Alcohol level:  Lab Results  Component Value Date   ETH <10 08/01/2023    Metabolic Disorder Labs: Lab Results  Component Value Date   HGBA1C 5.7 (H) 08/07/2023   MPG 116.89 08/07/2023   MPG 119.76 08/06/2023   No results found for: "PROLACTIN" Lab Results  Component Value Date   CHOL 159 08/06/2023   TRIG 92 08/06/2023   HDL 47 08/06/2023   CHOLHDL 3.4 08/06/2023   VLDL 18 08/06/2023   LDLCALC 94 08/06/2023    Physical Findings: AIMS: Facial and Oral Movements Muscles of Facial Expression: None Lips and Perioral Area: None Jaw: None Tongue: None,Extremity Movements Upper (arms, wrists, hands, fingers): None Lower (legs, knees, ankles, toes): None, Trunk Movements Neck, shoulders, hips: None, Global Judgements Severity of abnormal  movements overall : None Incapacitation due to abnormal movements: None Patient's awareness of abnormal movements: No Awareness, Dental Status Current problems with teeth and/or dentures?: No Does patient usually wear dentures?: No Edentia?: No  CIWA:  N/A  COWS:   N/A  Musculoskeletal: Strength & Muscle Tone: within normal limits Gait & Station: normal Patient leans: N/A  Psychiatric Specialty Exam:  Presentation  General Appearance:  Casual; Fairly Groomed  Eye Contact: Good  Speech: Clear and Coherent  Speech Volume: Normal  Handedness: -- (Did not assess)  Mood and Affect  Mood: Euthymic  Affect: Congruent  Thought Process  Thought Processes: Linear and organized yet concrete  Descriptions of Associations: More linear, no circumstantiality or tangentiality noted  Orientation:Full (Time, Place and Person)  Thought Content: Confused, at times illogical  History of Schizophrenia/Schizoaffective disorder:Yes  Duration of Psychotic Symptoms:Greater than six months  Hallucinations:Hallucinations: None Description of Auditory Hallucinations: -- (Denies)    Ideas of Reference: No paranoia or delusions noted  Suicidal Thoughts:Suicidal Thoughts: No    Homicidal Thoughts:Homicidal Thoughts: No    Sensorium  Memory: Immediate Fair; Recent Fair  Judgment: Fair  Insight: Fair  Art therapist  Concentration: Fair  Attention Span: Fair  Recall: [employer] of Knowledge: Fair  Language: Fair  Psychomotor Activity  Psychomotor Activity: Psychomotor Activity: Restlessness    Assets  Assets: Housing; Physical Health; Resilience; Social Support  Sleep  Sleep: Sleep: Good    Physical Exam: Physical Exam Vitals and nursing note reviewed.  Constitutional:      General: He is not in acute distress.    Appearance: He is normal weight. He is not toxic-appearing.  HENT:     Head: Normocephalic.     Nose: Nose normal.      Mouth/Throat:     Pharynx: Oropharynx is clear.  Eyes:     Conjunctiva/sclera: Conjunctivae normal.  Cardiovascular:     Pulses: Normal pulses.  Pulmonary:     Effort:  No respiratory distress.  Abdominal:     Comments: Deferred  Genitourinary:    Comments: Deferred Musculoskeletal:        General: Normal range of motion.     Cervical back: Normal range of motion.  Skin:    General: Skin is dry.  Neurological:     Mental Status: He is alert. He is disoriented.     Motor: No weakness.     Coordination: Coordination normal.    Review of Systems  Constitutional:  Negative for chills and fever.  HENT:  Negative for congestion, hearing loss and sore throat.   Eyes:  Negative for discharge and redness.  Respiratory:  Negative for cough, sputum production, shortness of breath and wheezing.   Cardiovascular:  Negative for chest pain and palpitations.  Gastrointestinal:  Negative for constipation, diarrhea, nausea and vomiting.  Genitourinary:  Negative for dysuria.  Musculoskeletal:  Negative for falls.  Neurological:  Negative for dizziness, tingling, tremors and headaches.  Endo/Heme/Allergies:        See allergy listing  Psychiatric/Behavioral:  Negative for depression, hallucinations, memory loss, substance abuse and suicidal ideas. The patient is not nervous/anxious and does not have insomnia.        +Delusional  All other systems reviewed and are negative.  Blood pressure 124/80, pulse 92, temperature 97.7 F (36.5 C), temperature source Oral, resp. rate 16, height 5\' 10"  (1.778 m), weight 58.3 kg, SpO2 100%. Body mass index is 18.45 kg/m.   Treatment Plan Summary: Daily contact with patient to assess and evaluate symptoms and progress in treatment and Medication management  Assessment: Principal/active diagnoses: Schizophrenia, paranoid type  Plan: The risks/benefits/side-effects/alternatives to the medications in use were discussed in detail with the patient and  time was given for patient's questions. The patient consents to medication trial.     Safety and Monitoring: Involuntary admission to inpatient psychiatric unit for safety, stabilization and treatment Daily contact with patient to assess and evaluate symptoms and progress in treatment Patient's case to be discussed in multi-disciplinary team meeting Observation Level : q15 minute checks Vital signs: q12 hours Precautions: Safety   Medication plan: -Discontinued olanzapine 20 mg po due to ineffectiveness (home medication) 1/22.  -Continue Haldol 10 mg twice daily for psychosis and mood stabilization. -Continue Cogentin 0.5 mg twice daily  -Continue Depakote ER 1,000 mg po Q bedtime for mood stabilization (home med).  1-19 VA level: 72 therapeutic  LFTs wnl 08/10/2023: Ammonia level ordered, liver function tests ordered 08/14/2023: Ammonia level ordered, liver function tests ordered for collection on 1/26.  -Continue Albuterol solution 2.5 mg Q 6 hours prn for SOB.  -Continue Nicotine patch 14 mg trans-dermally Q 24 hours for nicotine withdrawal.   CK has down trended to normal range    Agitation protocol: ---Haldol 5 mg, oral, 3 times daily as needed, mild agitation --Benadryl 50 mg, oral, 3 times daily as needed, mild agitation      OR                                --Haldol injection 5 mg, IM, 3 times daily as needed, moderate agitation --Benadryl injection 50 mg, IM, 3 times daily as needed, moderate agitation --Ativan injection 2 mg, IM, 3 times daily as needed, moderate agitation         OR                             --  Haldol injection 5 mg, IM, 3 times daily as needed, severe agitation --Benadryl injection 50 mg, IM, 3 times daily as needed, severe agitation --Ativan injection 2 mg, IM, 3 times daily as needed, severe agitation   Other PRNS -Continue Tylenol 650 mg every 6 hours PRN for mild pain -Continue Maalox 30 ml Q 4 hrs PRN for indigestion -Continue MOM 30 ml po Q  6 hrs for constipation  Discharge Planning: Social work and case management to assist with discharge planning and identification of hospital follow-up needs prior to discharge Estimated LOS: 3-4 more days if approaching baseline  Discharge Concerns: Need to establish a safety plan; Medication compliance and effectiveness Discharge Goals: Return home with outpatient referrals for mental health follow-up including medication management/psychotherapy  Congetta Odriscoll Abbott Pao, MD 08/16/2023, 11:36 AM  Patient ID: Cesar Robinson, male   DOB: [birth year], 43 y.o.   MRN: [id number] Patient ID: Cesar Robinson, male   DOB: Nov 23, 1980, 43 y.o.   MRN: [id number] Patient ID: Cesar Robinson, male   DOB: [birth year], 43 y.o.   MRN: [id number] Patient ID: Cesar Robinson, male   DOB: 03/18/1981, 43 y.o.   MRN: [id number] Patient ID: Cesar Robinson, male   DOB: [birth year], 43 y.o.   MRN: [id number] Patient ID: Cesar Robinson, male   DOB: 28-Jul-1980, 43 y.o.   MRN: [id number]

## 2023-08-16 NOTE — Progress Notes (Signed)
Dar Note: Patient presents anxious and restless.  Observed pacing the hallway asking staff for discharge.  Denies suicidal thoughts, auditory and visual hallucinations.  Medications given as prescribed.  Routine safety checks maintained.  Patient attended group and participated.  Patient is safe on the unit.

## 2023-08-16 NOTE — Group Note (Signed)
LCSW Group Therapy Note   Group Date: 08/16/2023 Start Time: 1300 End Time: 1400   Type of Therapy:  Group Therapy   Topic:  Finding Balance: Using Wise Mind for Thoughtful Decisions  Participation:  patient was present.  He listened but didn't participate in the conversation.   Objective:  the objective of this class is to help participants understand the concept of Weston Settle Mind and learn how to apply it to real-life situations to make balanced, thoughtful decisions. Participants will gain tools to manage emotions, consider logic, and find a middle ground that leads to healthier responses and outcomes.  Goals: Understand the concept of Wise Mind.  Participants will learn the difference between Emotional Mind, Reasonable Mind, and Weston Settle Mind, and how West[place] Settle Mind helps in balancing emotions and logic to make thoughtful decisions. Recognize when you're in Emotional Mind or Reasonable Mind.  Participants will identify the signs of Emotional Mind and Reasonable Mind in their own reactions to situations and understand how to move into Wise Mind for more balanced responses. Practice applying Weston Settle Mind to real-life situations.  Through scenarios and group activities, participants will practice using Wise Mind in everyday situations, learning how to acknowledge their emotions, think logically, and create solutions that are thoughtful and balanced.  Summary:  In this class, we explored the concept of Wise Mind--the balance between Emotional Mind and Reasonable Mind. We discussed how Emotional Mind can sometimes lead to impulsive, reactive decisions driven by intense feelings, and how Reasonable Mind might ignore feelings altogether, focusing only on facts and logic. Weston Settle Mind is the middle ground that combines both, allowing you to consider your emotions and use logic to make balanced, thoughtful decisions.  We learned how to recognize when we're in Emotional Mind or Reasonable Mind, and practiced using Wise  Mind in real-life situations. By using Alfonse Flavors, we can improve how we handle challenging situations, make better decisions, and strengthen our relationships with others.  Therapeutic Modalities: Elements of DBT  Khyri Hinzman, LCSWA 08/16/2023, 2:11 PM

## 2023-08-17 DIAGNOSIS — F200 Paranoid schizophrenia: Secondary | ICD-10-CM

## 2023-08-17 MED ORDER — ALBUTEROL SULFATE HFA 108 (90 BASE) MCG/ACT IN AERS: 1.0000 | INHALATION_SPRAY | Freq: Four times a day (QID) | RESPIRATORY_TRACT | 2 refills | Status: AC | PRN

## 2023-08-17 MED ORDER — DIVALPROEX SODIUM ER 500 MG PO TB24: 1000.0000 mg | ORAL_TABLET | Freq: Every day | ORAL | 0 refills | Status: AC

## 2023-08-17 MED ORDER — TRAZODONE HCL 100 MG PO TABS: 100.0000 mg | ORAL_TABLET | Freq: Every day | ORAL | 0 refills | Status: AC

## 2023-08-17 MED ORDER — BENZTROPINE MESYLATE 0.5 MG PO TABS: 0.5000 mg | ORAL_TABLET | Freq: Two times a day (BID) | ORAL | 0 refills | Status: AC

## 2023-08-17 MED ORDER — NICOTINE 14 MG/24HR TD PT24: 14.0000 mg | MEDICATED_PATCH | Freq: Every day | TRANSDERMAL | 0 refills | Status: AC

## 2023-08-17 MED ORDER — HALOPERIDOL 10 MG PO TABS: 10.0000 mg | ORAL_TABLET | Freq: Two times a day (BID) | ORAL | 0 refills | Status: AC

## 2023-08-17 NOTE — Progress Notes (Signed)
  [facility] Adult Case Management Discharge Plan :  Will you be returning to the same living situation after discharge:  Yes, Will Bonnet (mom) [phone]  At discharge, do you have transportation home?: Yes,  Will Bonnet (mom) will pick him up at 1 PM Do you have the ability to pay for your medications:  Yes, patient has insurance  Release of information consent forms completed and in the chart;  Patient's signature needed at discharge.  Patient to Follow up at:  Follow-up Information     [facility] Follow up on 08/18/2023.   Why: You have a hospital follow up appointment for therapy and medication management services on  08/18/23 at 11:00 am.  The appointment will be Virtual, telehealth. Contact information: [address]  [address] [place] [address] [phone]                 Next level of care provider has access to [facility] Link:no  Safety Planning and Suicide Prevention discussed: Yes,  Will Bonnet (mom) [phone]     Has patient been referred to the Quitline?: Patient refused referral for treatment  Patient has been referred for addiction treatment: Patient refused referral for treatment.  Bluford Sedler O Xoe Hoe, LCSWA 08/17/2023, 9:45 AM

## 2023-08-17 NOTE — [facility] Group Notes (Signed)
[facility] Group Notes:  (Nursing/MHT/Case Management/Adjunct) Adult Psychoeducational Group Note  Date:  08/17/2023 Time:  10:28 AM  Group Topic/Focus:  Goals Group:   The focus of this group is to help patients establish daily goals to achieve during treatment and discuss how the patient can incorporate goal setting into their daily lives to aide in recovery. Orientation:   The focus of this group is to educate the patient on the purpose and policies of crisis stabilization and provide a format to answer questions about their admission.  The group details unit policies and expectations of patients while admitted.  Participation Level:  Did Not Attend  Additional Comments:  Did not attend  Lucilla Edin 08/17/2023, 10:28 AM

## 2023-08-17 NOTE — [facility] Suicide Risk Assessment (Signed)
[facility] INPATIENT:  Family/Significant Other Suicide Prevention Education  Suicide Prevention Education:  Education Completed; Cesar Robinson (mom) [phone],  (name of family member/significant other) has been identified by the patient as the family member/significant other with whom the patient will be residing, and identified as the person(s) who will aid the patient in the event of a mental health crisis (suicidal ideations/suicide attempt).  With written consent from the patient, the family member/significant other has been provided the following suicide prevention education, prior to the and/or following the discharge of the patient.  Mom said they don't have any guns.  She will secure medications and sharp objects (such as knives, scissors, etc).  Mom doesn't have any safety concerns about patient coming back home.  She will pick up patient at 1 PM.  The suicide prevention education provided includes the following: Suicide risk factors Suicide prevention and interventions National Suicide Hotline telephone number [facility] assessment telephone number [facility] Emergency Assistance 911 [facility] and/or Residential Mobile Crisis Unit telephone number  Request made of family/significant other to: Remove weapons (e.g., guns, rifles, knives), all items previously/currently identified as safety concern.   Remove drugs/medications (over-the-counter, prescriptions, illicit drugs), all items previously/currently identified as a safety concern.  The family member/significant other verbalizes understanding of the suicide prevention education information provided.  The family member/significant other agrees to remove the items of safety concern listed above.  Cesar Robinson 08/17/2023, 9:19 AM
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
## 2023-08-17 NOTE — Discharge Instructions (Signed)
-  Follow-up with your outpatient psychiatric provider -instructions on appointment date, time, and address (location) are provided to you in discharge paperwork.  -Take your psychiatric medications as prescribed at discharge - instructions are provided to you in the discharge paperwork  Labs: 08-08-23: VA level: 72 1-21 LFTs: wnl 1-26 ammonia: 25 wnl   -Follow-up with outpatient primary care doctor and other specialists -for management of preventative medicine and any chronic medical disease.  -Recommend abstinence from alcohol, tobacco, and other illicit drug use at discharge.   -If your psychiatric symptoms recur, worsen, or if you have side effects to your psychiatric medications, call your outpatient psychiatric provider, 911, 988 or go to the nearest emergency department.  -If suicidal thoughts occur, call your outpatient psychiatric provider, 911, 988 or go to the nearest emergency department.  Naloxone (Narcan) can help reverse an overdose when given to the victim quickly.  [facility] offers free naloxone kits and instructions/training on its use.  Add naloxone to your first aid kit and you can help save a life.   Pick up your free kit at the following locations:   [place]:  [facility], [address] [place] [address] [phone]) [facility] [address] [place] [address] [phone]) [facility] [address] [place] [address]  [place]: [facility] [address] [place] [address] ([phone]) [facility] [address] [place] [address] [phone])

## 2023-08-17 NOTE — Progress Notes (Signed)
Pt discharged to lobby. Pt was stable and appreciative at that time. All papers and prescriptions were given and valuables returned. Verbal understanding expressed. Denies SI/HI and A/VH. Pt given opportunity to express concerns and ask questions.

## 2023-08-17 NOTE — Care Management Important Message (Signed)
Patient informed of right to appeal discharge, provided phone number to [facility]. Patient expressed no interest in appealing discharge at this time. CSW will continue to monitor situation.
